# Patient Record
Sex: Male | Born: 1985 | Race: White | Hispanic: No | Marital: Single | Smoking: Never smoker
Health system: Southern US, Community
[De-identification: ages and names within clinical notes are randomized; demographics above are authoritative.]

---

## 2015-05-15 ENCOUNTER — Emergency Department (HOSPITAL_COMMUNITY): Payer: Worker's Compensation

## 2015-05-15 ENCOUNTER — Inpatient Hospital Stay (HOSPITAL_COMMUNITY)
Admission: EM | Admit: 2015-05-15 | Discharge: 2015-05-21 | DRG: 956 | Disposition: A | Discharge status: Rehabilitation Facility | Payer: Worker's Compensation | Attending: General Surgery | Admitting: General Surgery

## 2015-05-15 ENCOUNTER — Encounter (HOSPITAL_COMMUNITY): Payer: Self-pay | Admitting: Emergency Medicine

## 2015-05-15 ENCOUNTER — Inpatient Hospital Stay (HOSPITAL_COMMUNITY): Payer: Worker's Compensation

## 2015-05-15 DIAGNOSIS — S82009A Unspecified fracture of unspecified patella, initial encounter for closed fracture: Secondary | ICD-10-CM

## 2015-05-15 DIAGNOSIS — S060X9A Concussion with loss of consciousness of unspecified duration, initial encounter: Secondary | ICD-10-CM | POA: Diagnosis present

## 2015-05-15 DIAGNOSIS — S93401A Sprain of unspecified ligament of right ankle, initial encounter: Secondary | ICD-10-CM | POA: Diagnosis present

## 2015-05-15 DIAGNOSIS — S060X9S Concussion with loss of consciousness of unspecified duration, sequela: Secondary | ICD-10-CM | POA: Diagnosis not present

## 2015-05-15 DIAGNOSIS — S92332A Displaced fracture of third metatarsal bone, left foot, initial encounter for closed fracture: Secondary | ICD-10-CM | POA: Diagnosis present

## 2015-05-15 DIAGNOSIS — S92302S Fracture of unspecified metatarsal bone(s), left foot, sequela: Secondary | ICD-10-CM | POA: Diagnosis not present

## 2015-05-15 DIAGNOSIS — S7290XA Unspecified fracture of unspecified femur, initial encounter for closed fracture: Secondary | ICD-10-CM | POA: Diagnosis present

## 2015-05-15 DIAGNOSIS — R52 Pain, unspecified: Secondary | ICD-10-CM

## 2015-05-15 DIAGNOSIS — R339 Retention of urine, unspecified: Secondary | ICD-10-CM | POA: Diagnosis present

## 2015-05-15 DIAGNOSIS — S82001S Unspecified fracture of right patella, sequela: Secondary | ICD-10-CM | POA: Diagnosis not present

## 2015-05-15 DIAGNOSIS — R402361 Coma scale, best motor response, obeys commands, in the field [EMT or ambulance]: Secondary | ICD-10-CM | POA: Diagnosis present

## 2015-05-15 DIAGNOSIS — K5901 Slow transit constipation: Secondary | ICD-10-CM | POA: Diagnosis not present

## 2015-05-15 DIAGNOSIS — S92322A Displaced fracture of second metatarsal bone, left foot, initial encounter for closed fracture: Secondary | ICD-10-CM | POA: Diagnosis present

## 2015-05-15 DIAGNOSIS — R402241 Coma scale, best verbal response, confused conversation, in the field [EMT or ambulance]: Secondary | ICD-10-CM | POA: Diagnosis present

## 2015-05-15 DIAGNOSIS — S72401S Unspecified fracture of lower end of right femur, sequela: Secondary | ICD-10-CM | POA: Diagnosis not present

## 2015-05-15 DIAGNOSIS — S069X3S Unspecified intracranial injury with loss of consciousness of 1 hour to 5 hours 59 minutes, sequela: Secondary | ICD-10-CM | POA: Diagnosis not present

## 2015-05-15 DIAGNOSIS — S36039A Unspecified laceration of spleen, initial encounter: Secondary | ICD-10-CM | POA: Diagnosis present

## 2015-05-15 DIAGNOSIS — F419 Anxiety disorder, unspecified: Secondary | ICD-10-CM | POA: Diagnosis not present

## 2015-05-15 DIAGNOSIS — T79A0XA Compartment syndrome, unspecified, initial encounter: Secondary | ICD-10-CM | POA: Diagnosis not present

## 2015-05-15 DIAGNOSIS — S022XXS Fracture of nasal bones, sequela: Secondary | ICD-10-CM | POA: Diagnosis not present

## 2015-05-15 DIAGNOSIS — Y92488 Other paved roadways as the place of occurrence of the external cause: Secondary | ICD-10-CM | POA: Diagnosis not present

## 2015-05-15 DIAGNOSIS — S92302A Fracture of unspecified metatarsal bone(s), left foot, initial encounter for closed fracture: Secondary | ICD-10-CM | POA: Diagnosis present

## 2015-05-15 DIAGNOSIS — S2220XA Unspecified fracture of sternum, initial encounter for closed fracture: Secondary | ICD-10-CM | POA: Diagnosis present

## 2015-05-15 DIAGNOSIS — S72431A Displaced fracture of medial condyle of right femur, initial encounter for closed fracture: Secondary | ICD-10-CM | POA: Diagnosis present

## 2015-05-15 DIAGNOSIS — S92312A Displaced fracture of first metatarsal bone, left foot, initial encounter for closed fracture: Secondary | ICD-10-CM | POA: Diagnosis present

## 2015-05-15 DIAGNOSIS — R402141 Coma scale, eyes open, spontaneous, in the field [EMT or ambulance]: Secondary | ICD-10-CM | POA: Diagnosis present

## 2015-05-15 DIAGNOSIS — S72401D Unspecified fracture of lower end of right femur, subsequent encounter for closed fracture with routine healing: Secondary | ICD-10-CM | POA: Diagnosis not present

## 2015-05-15 DIAGNOSIS — S069X9A Unspecified intracranial injury with loss of consciousness of unspecified duration, initial encounter: Secondary | ICD-10-CM | POA: Diagnosis present

## 2015-05-15 DIAGNOSIS — S82041A Displaced comminuted fracture of right patella, initial encounter for closed fracture: Secondary | ICD-10-CM | POA: Diagnosis present

## 2015-05-15 DIAGNOSIS — S92352A Displaced fracture of fifth metatarsal bone, left foot, initial encounter for closed fracture: Secondary | ICD-10-CM | POA: Diagnosis present

## 2015-05-15 DIAGNOSIS — S36030A Superficial (capsular) laceration of spleen, initial encounter: Secondary | ICD-10-CM | POA: Diagnosis present

## 2015-05-15 DIAGNOSIS — S82001A Unspecified fracture of right patella, initial encounter for closed fracture: Secondary | ICD-10-CM | POA: Diagnosis present

## 2015-05-15 DIAGNOSIS — I959 Hypotension, unspecified: Secondary | ICD-10-CM | POA: Diagnosis present

## 2015-05-15 DIAGNOSIS — Z419 Encounter for procedure for purposes other than remedying health state, unspecified: Secondary | ICD-10-CM

## 2015-05-15 DIAGNOSIS — G8918 Other acute postprocedural pain: Secondary | ICD-10-CM | POA: Insufficient documentation

## 2015-05-15 DIAGNOSIS — S72401A Unspecified fracture of lower end of right femur, initial encounter for closed fracture: Secondary | ICD-10-CM | POA: Diagnosis present

## 2015-05-15 DIAGNOSIS — S022XXA Fracture of nasal bones, initial encounter for closed fracture: Secondary | ICD-10-CM | POA: Diagnosis present

## 2015-05-15 DIAGNOSIS — S069XAA Unspecified intracranial injury with loss of consciousness status unknown, initial encounter: Secondary | ICD-10-CM | POA: Diagnosis present

## 2015-05-15 DIAGNOSIS — S72491S Other fracture of lower end of right femur, sequela: Secondary | ICD-10-CM | POA: Diagnosis not present

## 2015-05-15 DIAGNOSIS — D62 Acute posthemorrhagic anemia: Secondary | ICD-10-CM | POA: Diagnosis not present

## 2015-05-15 DIAGNOSIS — F4323 Adjustment disorder with mixed anxiety and depressed mood: Secondary | ICD-10-CM | POA: Diagnosis not present

## 2015-05-15 DIAGNOSIS — M84453A Pathological fracture, unspecified femur, initial encounter for fracture: Secondary | ICD-10-CM | POA: Diagnosis not present

## 2015-05-15 DIAGNOSIS — S92342A Displaced fracture of fourth metatarsal bone, left foot, initial encounter for closed fracture: Secondary | ICD-10-CM | POA: Diagnosis present

## 2015-05-15 DIAGNOSIS — F1010 Alcohol abuse, uncomplicated: Secondary | ICD-10-CM | POA: Insufficient documentation

## 2015-05-15 DIAGNOSIS — F411 Generalized anxiety disorder: Secondary | ICD-10-CM | POA: Insufficient documentation

## 2015-05-15 LAB — CBC
HEMATOCRIT: 40.6 % (ref 39.0–52.0)
HEMOGLOBIN: 13.9 g/dL (ref 13.0–17.0)
MCH: 29.6 pg (ref 26.0–34.0)
MCHC: 34.2 g/dL (ref 30.0–36.0)
MCV: 86.6 fL (ref 78.0–100.0)
Platelets: 182 10*3/uL (ref 150–400)
RBC: 4.69 MIL/uL (ref 4.22–5.81)
RDW: 11.6 % (ref 11.5–15.5)
WBC: 12.9 10*3/uL — AB (ref 4.0–10.5)

## 2015-05-15 LAB — PROTIME-INR
INR: 1.11 (ref 0.00–1.49)
PROTHROMBIN TIME: 14.5 s (ref 11.6–15.2)

## 2015-05-15 LAB — COMPREHENSIVE METABOLIC PANEL
ALBUMIN: 3.7 g/dL (ref 3.5–5.0)
ALT: 24 U/L (ref 17–63)
ANION GAP: 10 (ref 5–15)
AST: 48 U/L — ABNORMAL HIGH (ref 15–41)
Alkaline Phosphatase: 59 U/L (ref 38–126)
BILIRUBIN TOTAL: 0.8 mg/dL (ref 0.3–1.2)
BUN: 13 mg/dL (ref 6–20)
CHLORIDE: 110 mmol/L (ref 101–111)
CO2: 20 mmol/L — ABNORMAL LOW (ref 22–32)
Calcium: 8.5 mg/dL — ABNORMAL LOW (ref 8.9–10.3)
Creatinine, Ser: 1.01 mg/dL (ref 0.61–1.24)
GFR calc Af Amer: >60 mL/min (ref >60)
GLUCOSE: 126 mg/dL — AB (ref 65–99)
POTASSIUM: 3.9 mmol/L (ref 3.5–5.1)
Sodium: 140 mmol/L (ref 135–145)
TOTAL PROTEIN: 5.7 g/dL — AB (ref 6.5–8.1)

## 2015-05-15 LAB — SAMPLE TO BLOOD BANK

## 2015-05-15 LAB — ETHANOL: Alcohol, Ethyl (B): <5 mg/dL (ref <5)

## 2015-05-15 LAB — CDS SEROLOGY

## 2015-05-15 MED ORDER — HYDROMORPHONE HCL 1 MG/ML IJ SOLN
1.0000 mg | INTRAMUSCULAR | Status: DC | PRN
  Administered 2015-05-15 – 2015-05-16 (×3): 1 mg via INTRAVENOUS
  Filled 2015-05-15 (×2): qty 1

## 2015-05-15 MED ORDER — ONDANSETRON HCL 4 MG/2ML IJ SOLN
4.0000 mg | Freq: Four times a day (QID) | INTRAMUSCULAR | Status: DC | PRN
  Administered 2015-05-15 – 2015-05-16 (×2): 4 mg via INTRAVENOUS
  Filled 2015-05-15 (×2): qty 2

## 2015-05-15 MED ORDER — METHOCARBAMOL 1000 MG/10ML IJ SOLN
500.0000 mg | Freq: Three times a day (TID) | INTRAMUSCULAR | Status: DC
Start: 2015-05-15 — End: 2015-05-15
  Filled 2015-05-15 (×2): qty 5

## 2015-05-15 MED ORDER — FENTANYL CITRATE (PF) 100 MCG/2ML IJ SOLN
50.0000 ug | Freq: Once | INTRAMUSCULAR | Status: AC
  Administered 2015-05-15: 50 ug via INTRAVENOUS
  Filled 2015-05-15: qty 2

## 2015-05-15 MED ORDER — POLYETHYLENE GLYCOL 3350 17 G PO PACK
17.0000 g | PACK | Freq: Every day | ORAL | Status: DC
  Administered 2015-05-17 – 2015-05-19 (×2): 17 g via ORAL
  Filled 2015-05-15 (×3): qty 1

## 2015-05-15 MED ORDER — DOCUSATE SODIUM 100 MG PO CAPS
100.0000 mg | ORAL_CAPSULE | Freq: Two times a day (BID) | ORAL | Status: DC
  Administered 2015-05-15 – 2015-05-21 (×9): 100 mg via ORAL
  Filled 2015-05-15 (×10): qty 1

## 2015-05-15 MED ORDER — IOHEXOL 300 MG/ML  SOLN: 100.0000 mL | Freq: Once | INTRAMUSCULAR | Status: DC | PRN

## 2015-05-15 MED ORDER — POTASSIUM CHLORIDE IN NACL 20-0.45 MEQ/L-% IV SOLN
INTRAVENOUS | Status: DC
  Administered 2015-05-16 (×2): via INTRAVENOUS
  Filled 2015-05-15 (×8): qty 1000

## 2015-05-15 MED ORDER — TETANUS-DIPHTH-ACELL PERTUSSIS 5-2.5-18.5 LF-MCG/0.5 IM SUSP
0.5000 mL | Freq: Once | INTRAMUSCULAR | Status: AC
  Administered 2015-05-15: 0.5 mL via INTRAMUSCULAR
  Filled 2015-05-15: qty 0.5

## 2015-05-15 MED ORDER — ONDANSETRON HCL 4 MG PO TABS: 4.0000 mg | ORAL_TABLET | Freq: Four times a day (QID) | ORAL | Status: DC | PRN

## 2015-05-15 MED ORDER — HYDROMORPHONE HCL 1 MG/ML IJ SOLN
0.5000 mg | INTRAMUSCULAR | Status: DC | PRN
  Administered 2015-05-15: 0.5 mg via INTRAVENOUS
  Filled 2015-05-15: qty 1

## 2015-05-15 MED ORDER — DEXTROSE 5 % IV SOLN
500.0000 mg | Freq: Three times a day (TID) | INTRAVENOUS | Status: DC | PRN
  Administered 2015-05-15 – 2015-05-16 (×2): 500 mg via INTRAVENOUS
  Filled 2015-05-15: qty 5

## 2015-05-15 MED ORDER — PANTOPRAZOLE SODIUM 40 MG IV SOLR
40.0000 mg | Freq: Every day | INTRAVENOUS | Status: DC
  Administered 2015-05-15 – 2015-05-16 (×2): 40 mg via INTRAVENOUS
  Filled 2015-05-15 (×2): qty 40

## 2015-05-15 MED ORDER — OXYCODONE HCL 5 MG PO TABS
5.0000 mg | ORAL_TABLET | ORAL | Status: DC | PRN
  Administered 2015-05-15: 10 mg via ORAL
  Administered 2015-05-15: 5 mg via ORAL
  Administered 2015-05-16 (×2): 15 mg via ORAL
  Filled 2015-05-15: qty 2
  Filled 2015-05-15: qty 3
  Filled 2015-05-15: qty 1
  Filled 2015-05-15: qty 3

## 2015-05-15 MED ORDER — METHOCARBAMOL 1000 MG/10ML IJ SOLN: 500.0000 mg | Freq: Three times a day (TID) | INTRAMUSCULAR | Status: DC | PRN

## 2015-05-15 MED ORDER — METHOCARBAMOL 1000 MG/10ML IJ SOLN: 500.0000 mg | Freq: Three times a day (TID) | INTRAMUSCULAR | Status: DC

## 2015-05-15 MED ORDER — HYDROMORPHONE HCL 1 MG/ML IJ SOLN
INTRAMUSCULAR | Status: AC
  Filled 2015-05-15: qty 1

## 2015-05-15 MED ORDER — PANTOPRAZOLE SODIUM 40 MG PO TBEC: 40.0000 mg | DELAYED_RELEASE_TABLET | Freq: Every day | ORAL | Status: DC
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
## 2015-05-15 NOTE — ED Notes (Signed)
Pt visibly uncomfortable. Verbal order given for  IV robaxin. Pharmacy contacted to send stat. Pt and family both appear to be anxious.
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
## 2015-05-15 NOTE — ED Notes (Signed)
Applied ice to left leg

## 2015-05-15 NOTE — Consult Note (Signed)
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 groin tenderness rotation of the left leg. Sensation intact on the dorsal plantar aspect of the left foot  Examination of the right foot demonstrates some swelling around the medial lateral malleolus. Syndesmosis feel stable. Compartments soft on the right-hand side. Small abrasion present on the right knee with significant effusion present patella was laterally subluxated. Gapping is present on the medial aspect of the patella deep to the skin. No groin pain with internal/external rotation of the right leg Assessment/Plan: Impression is left foot metatarsal fracture of metatarsal #1 with metatarsal head fractures 2 through 5. The metatarsal shaft fracture is displaced and will need fixation. This does not look like a Lisfranc injury. We will be able to better assess that once fixation on the first metatarsal is then performed. In regards to the right knee the patient on CT scan has evidence of inferior pole avulsion fracture along with medial patellar fracture indicating traumatic instability. Ligamentous examination really not possible due to the pain in the patient's having. Plan for the right knee is MRI scan to evaluate what is likely a large chondral defect on that lateral femoral condyle. He has an abrasion on the anterior  aspect of the knee which may preclude surgical intervention for the problem at this time. The fluid plan on aspiration of the knee and examination under anesthesia tomorrow during the time of fixation for the left foot fracture. Risks and benefits of surgical intervention discussed with the patient including but limited to infection or vessel damage prolonged period of nonweightbearing as well as need for further surgery on the right knee which could potentially be done either tomorrow or later time once 2 weeks depending on how the abrasion looks.  Cornelis Kluver SCOTT 05/15/2015, 6:50 PM

## 2015-05-15 NOTE — ED Notes (Signed)
Trauma MD at bedside.

## 2015-05-15 NOTE — ED Notes (Signed)
Ortho MD at bedside.
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
## 2015-05-15 NOTE — Progress Notes (Signed)
Orthopedic Tech Progress Note Patient Details:  Gary Hebert 1986/02/25 [id number]  Ortho Devices Type of Ortho Device: Ace wrap, Knee Immobilizer Ortho Device/Splint Location: RLE Ortho Device/Splint Interventions: Ordered, Application   Jennye Moccasin 05/15/2015, 7:23 PM

## 2015-05-15 NOTE — ED Notes (Signed)
Attempted report x 2 

## 2015-05-15 NOTE — ED Notes (Signed)
Pt's wife- Gary Hebert [phone]. EMS called pt's wife and she is on the way to hospital.
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
## 2015-05-15 NOTE — Progress Notes (Signed)
Orthopedic Tech Progress Note Patient Details:  Gary Hebert 1986-01-19 [id number]  Ortho Devices Type of Ortho Device: Ace wrap, Post (short leg) splint Ortho Device/Splint Location: LLE Ortho Device/Splint Interventions: Ordered, Application   Jennye Moccasin 05/15/2015, 8:46 PM
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
## 2015-05-15 NOTE — ED Notes (Signed)
Re-paged Dr. August Saucer x3 to [phone]

## 2015-05-15 NOTE — ED Notes (Signed)
Pt alert oriented to person, date- however asking repetitive questions about situation and place.
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
## 2015-05-15 NOTE — ED Notes (Signed)
Attempted report x1. 
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
## 2015-05-16 ENCOUNTER — Inpatient Hospital Stay (HOSPITAL_COMMUNITY): Payer: Worker's Compensation | Admitting: Certified Registered Nurse Anesthetist

## 2015-05-16 ENCOUNTER — Inpatient Hospital Stay (HOSPITAL_COMMUNITY): Payer: Worker's Compensation

## 2015-05-16 ENCOUNTER — Encounter (HOSPITAL_COMMUNITY): Admission: EM | Disposition: A | Discharge status: Rehabilitation Facility | Payer: Self-pay | Source: Home / Self Care

## 2015-05-16 DIAGNOSIS — S7290XA Unspecified fracture of unspecified femur, initial encounter for closed fracture: Secondary | ICD-10-CM | POA: Diagnosis present

## 2015-05-16 DIAGNOSIS — S82001A Unspecified fracture of right patella, initial encounter for closed fracture: Secondary | ICD-10-CM | POA: Diagnosis present

## 2015-05-16 DIAGNOSIS — S069XAA Unspecified intracranial injury with loss of consciousness status unknown, initial encounter: Secondary | ICD-10-CM | POA: Diagnosis present

## 2015-05-16 DIAGNOSIS — D62 Acute posthemorrhagic anemia: Secondary | ICD-10-CM | POA: Diagnosis not present

## 2015-05-16 DIAGNOSIS — S022XXA Fracture of nasal bones, initial encounter for closed fracture: Secondary | ICD-10-CM | POA: Diagnosis present

## 2015-05-16 DIAGNOSIS — S92302A Fracture of unspecified metatarsal bone(s), left foot, initial encounter for closed fracture: Secondary | ICD-10-CM | POA: Diagnosis present

## 2015-05-16 DIAGNOSIS — S93401A Sprain of unspecified ligament of right ankle, initial encounter: Secondary | ICD-10-CM | POA: Diagnosis present

## 2015-05-16 DIAGNOSIS — S36039A Unspecified laceration of spleen, initial encounter: Secondary | ICD-10-CM | POA: Diagnosis present

## 2015-05-16 DIAGNOSIS — S069X9A Unspecified intracranial injury with loss of consciousness of unspecified duration, initial encounter: Secondary | ICD-10-CM | POA: Diagnosis present

## 2015-05-16 DIAGNOSIS — S72401A Unspecified fracture of lower end of right femur, initial encounter for closed fracture: Secondary | ICD-10-CM | POA: Diagnosis present

## 2015-05-16 HISTORY — PX: COMPARTMENT MEASUREMENT: SHX6371

## 2015-05-16 HISTORY — PX: ORIF FEMUR FRACTURE: SHX2119

## 2015-05-16 HISTORY — PX: PERCUTANEOUS PINNING: SHX2209

## 2015-05-16 LAB — BASIC METABOLIC PANEL
ANION GAP: 11 (ref 5–15)
BUN: 10 mg/dL (ref 6–20)
CHLORIDE: 106 mmol/L (ref 101–111)
CO2: 22 mmol/L (ref 22–32)
CREATININE: 1.12 mg/dL (ref 0.61–1.24)
Calcium: 8.5 mg/dL — ABNORMAL LOW (ref 8.9–10.3)
GFR calc non Af Amer: >60 mL/min (ref >60)
Glucose, Bld: 110 mg/dL — ABNORMAL HIGH (ref 65–99)
POTASSIUM: 3.8 mmol/L (ref 3.5–5.1)
SODIUM: 139 mmol/L (ref 135–145)

## 2015-05-16 LAB — CBC
HCT: 36.6 % — ABNORMAL LOW (ref 39.0–52.0)
Hemoglobin: 12.5 g/dL — ABNORMAL LOW (ref 13.0–17.0)
MCH: 29.5 pg (ref 26.0–34.0)
MCHC: 34.2 g/dL (ref 30.0–36.0)
MCV: 86.3 fL (ref 78.0–100.0)
PLATELETS: 148 10*3/uL — AB (ref 150–400)
RBC: 4.24 MIL/uL (ref 4.22–5.81)
RDW: 11.7 % (ref 11.5–15.5)
WBC: 6.5 10*3/uL (ref 4.0–10.5)

## 2015-05-16 LAB — MRSA PCR SCREENING: MRSA BY PCR: NEGATIVE

## 2015-05-16 SURGERY — COMPARTMENT MEASUREMENT
Anesthesia: General | Site: Toe | Laterality: Right

## 2015-05-16 MED ORDER — OXYCODONE HCL 5 MG/5ML PO SOLN: 5.0000 mg | Freq: Once | ORAL | Status: DC | PRN

## 2015-05-16 MED ORDER — KETAMINE HCL 100 MG/ML IJ SOLN
INTRAMUSCULAR | Status: DC | PRN
  Administered 2015-05-16: 100 mg via INTRAVENOUS
  Administered 2015-05-16: 50 mg via INTRAVENOUS

## 2015-05-16 MED ORDER — FENTANYL CITRATE (PF) 250 MCG/5ML IJ SOLN
INTRAMUSCULAR | Status: AC
  Filled 2015-05-16: qty 5

## 2015-05-16 MED ORDER — HYDROMORPHONE 1 MG/ML IV SOLN
INTRAVENOUS | Status: DC
Start: 2015-05-16 — End: 2015-05-16
  Administered 2015-05-16: 0.5 mg via INTRAVENOUS

## 2015-05-16 MED ORDER — OXYCODONE HCL 5 MG PO TABS: 15.0000 mg | ORAL_TABLET | Freq: Once | ORAL | Status: DC | PRN

## 2015-05-16 MED ORDER — ACETAMINOPHEN 650 MG RE SUPP: 650.0000 mg | Freq: Four times a day (QID) | RECTAL | Status: DC | PRN

## 2015-05-16 MED ORDER — SODIUM CHLORIDE 0.9% FLUSH: 9.0000 mL | INTRAVENOUS | Status: DC | PRN

## 2015-05-16 MED ORDER — NALOXONE HCL 0.4 MG/ML IJ SOLN: 0.4000 mg | INTRAMUSCULAR | Status: DC | PRN

## 2015-05-16 MED ORDER — SUCCINYLCHOLINE CHLORIDE 20 MG/ML IJ SOLN
INTRAMUSCULAR | Status: DC | PRN
  Administered 2015-05-16: 100 mg via INTRAVENOUS

## 2015-05-16 MED ORDER — MIDAZOLAM HCL 2 MG/2ML IJ SOLN
INTRAMUSCULAR | Status: AC
Start: 2015-05-16 — End: 2015-05-16
  Filled 2015-05-16: qty 2

## 2015-05-16 MED ORDER — DIPHENHYDRAMINE HCL 12.5 MG/5ML PO ELIX: 12.5000 mg | ORAL_SOLUTION | Freq: Four times a day (QID) | ORAL | Status: DC | PRN

## 2015-05-16 MED ORDER — FENTANYL CITRATE (PF) 250 MCG/5ML IJ SOLN
INTRAMUSCULAR | Status: DC | PRN
  Administered 2015-05-16: 100 ug via INTRAVENOUS
  Administered 2015-05-16 (×3): 50 ug via INTRAVENOUS

## 2015-05-16 MED ORDER — HYDROMORPHONE HCL 1 MG/ML IJ SOLN
INTRAMUSCULAR | Status: AC
  Administered 2015-05-18: 0.5 mg via INTRAVENOUS
  Filled 2015-05-16: qty 2

## 2015-05-16 MED ORDER — PROPOFOL 10 MG/ML IV BOLUS
INTRAVENOUS | Status: AC
  Filled 2015-05-16: qty 20

## 2015-05-16 MED ORDER — PROMETHAZINE HCL 25 MG/ML IJ SOLN: 6.2500 mg | INTRAMUSCULAR | Status: DC | PRN

## 2015-05-16 MED ORDER — ONDANSETRON HCL 4 MG/2ML IJ SOLN
INTRAMUSCULAR | Status: AC
  Filled 2015-05-16: qty 2

## 2015-05-16 MED ORDER — DIPHENHYDRAMINE HCL 50 MG/ML IJ SOLN: 12.5000 mg | Freq: Four times a day (QID) | INTRAMUSCULAR | Status: DC | PRN

## 2015-05-16 MED ORDER — METOCLOPRAMIDE HCL 5 MG PO TABS: 5.0000 mg | ORAL_TABLET | Freq: Three times a day (TID) | ORAL | Status: DC | PRN

## 2015-05-16 MED ORDER — CEFAZOLIN SODIUM-DEXTROSE 2-3 GM-% IV SOLR
INTRAVENOUS | Status: DC | PRN
  Administered 2015-05-16 (×2): 2 g via INTRAVENOUS

## 2015-05-16 MED ORDER — METOCLOPRAMIDE HCL 5 MG/ML IJ SOLN: 5.0000 mg | Freq: Three times a day (TID) | INTRAMUSCULAR | Status: DC | PRN

## 2015-05-16 MED ORDER — ONDANSETRON HCL 4 MG PO TABS: 4.0000 mg | ORAL_TABLET | Freq: Four times a day (QID) | ORAL | Status: DC | PRN

## 2015-05-16 MED ORDER — LIDOCAINE HCL (CARDIAC) 20 MG/ML IV SOLN
INTRAVENOUS | Status: AC
  Filled 2015-05-16: qty 5

## 2015-05-16 MED ORDER — POTASSIUM CHLORIDE IN NACL 20-0.45 MEQ/L-% IV SOLN
INTRAVENOUS | Status: DC
  Filled 2015-05-16 (×2): qty 1000

## 2015-05-16 MED ORDER — PROPOFOL 10 MG/ML IV BOLUS
INTRAVENOUS | Status: DC | PRN
  Administered 2015-05-16: 200 mg via INTRAVENOUS

## 2015-05-16 MED ORDER — ONDANSETRON HCL 4 MG/2ML IJ SOLN
4.0000 mg | Freq: Four times a day (QID) | INTRAMUSCULAR | Status: DC | PRN
  Administered 2015-05-16 – 2015-05-19 (×3): 4 mg via INTRAVENOUS
  Filled 2015-05-16 (×3): qty 2

## 2015-05-16 MED ORDER — KETAMINE HCL 100 MG/ML IJ SOLN
INTRAMUSCULAR | Status: AC
  Filled 2015-05-16: qty 1

## 2015-05-16 MED ORDER — ONDANSETRON HCL 4 MG/2ML IJ SOLN
INTRAMUSCULAR | Status: DC | PRN
  Administered 2015-05-16: 4 mg via INTRAVENOUS

## 2015-05-16 MED ORDER — CEFAZOLIN SODIUM-DEXTROSE 2-3 GM-% IV SOLR
INTRAVENOUS | Status: AC
  Filled 2015-05-16: qty 50

## 2015-05-16 MED ORDER — DEXAMETHASONE SODIUM PHOSPHATE 4 MG/ML IJ SOLN
INTRAMUSCULAR | Status: DC | PRN
  Administered 2015-05-16: 10 mg via INTRAVENOUS

## 2015-05-16 MED ORDER — DEXAMETHASONE SODIUM PHOSPHATE 10 MG/ML IJ SOLN
INTRAMUSCULAR | Status: AC
  Filled 2015-05-16: qty 1

## 2015-05-16 MED ORDER — LACTATED RINGERS IV SOLN
INTRAVENOUS | Status: DC
  Administered 2015-05-16 (×4): via INTRAVENOUS

## 2015-05-16 MED ORDER — METHOCARBAMOL 1000 MG/10ML IJ SOLN
1000.0000 mg | Freq: Three times a day (TID) | INTRAVENOUS | Status: DC | PRN
  Administered 2015-05-16 – 2015-05-17 (×2): 1000 mg via INTRAVENOUS
  Filled 2015-05-16 (×2): qty 10

## 2015-05-16 MED ORDER — ONDANSETRON HCL 4 MG/2ML IJ SOLN: 4.0000 mg | Freq: Four times a day (QID) | INTRAMUSCULAR | Status: DC | PRN

## 2015-05-16 MED ORDER — 0.9 % SODIUM CHLORIDE (POUR BTL) OPTIME
TOPICAL | Status: DC | PRN
  Administered 2015-05-16 (×3): 1000 mL

## 2015-05-16 MED ORDER — ACETAMINOPHEN 325 MG PO TABS
650.0000 mg | ORAL_TABLET | Freq: Four times a day (QID) | ORAL | Status: DC | PRN
  Administered 2015-05-17 – 2015-05-19 (×4): 650 mg via ORAL
  Filled 2015-05-16 (×4): qty 2

## 2015-05-16 MED ORDER — HYDROMORPHONE HCL 1 MG/ML IJ SOLN
0.2500 mg | INTRAMUSCULAR | Status: DC | PRN
  Administered 2015-05-16 (×3): 0.5 mg via INTRAVENOUS

## 2015-05-16 MED ORDER — HYDROMORPHONE 1 MG/ML IV SOLN
INTRAVENOUS | Status: DC
  Administered 2015-05-16: 8 mg via INTRAVENOUS
  Administered 2015-05-16: 4.96 mg via INTRAVENOUS
  Administered 2015-05-17: 6.22 mg via INTRAVENOUS
  Administered 2015-05-17: 02:00:00 via INTRAVENOUS
  Filled 2015-05-16: qty 25

## 2015-05-16 MED ORDER — SUCCINYLCHOLINE CHLORIDE 20 MG/ML IJ SOLN
INTRAMUSCULAR | Status: AC
  Filled 2015-05-16: qty 1

## 2015-05-16 MED ORDER — HYDROMORPHONE 1 MG/ML IV SOLN
INTRAVENOUS | Status: DC
  Administered 2015-05-16: 03:00:00 via INTRAVENOUS
  Administered 2015-05-16: 3 mg via INTRAVENOUS
  Filled 2015-05-16: qty 25

## 2015-05-16 MED ORDER — CETYLPYRIDINIUM CHLORIDE 0.05 % MT LIQD
7.0000 mL | Freq: Two times a day (BID) | OROMUCOSAL | Status: DC
  Administered 2015-05-16 – 2015-05-21 (×7): 7 mL via OROMUCOSAL

## 2015-05-16 MED ORDER — MIDAZOLAM HCL 5 MG/5ML IJ SOLN
INTRAMUSCULAR | Status: DC | PRN
  Administered 2015-05-16: 2 mg via INTRAVENOUS

## 2015-05-16 MED ORDER — CEFAZOLIN SODIUM-DEXTROSE 2-3 GM-% IV SOLR
2.0000 g | Freq: Four times a day (QID) | INTRAVENOUS | Status: AC
  Administered 2015-05-17 (×3): 2 g via INTRAVENOUS
  Filled 2015-05-16 (×4): qty 50

## 2015-05-16 SURGICAL SUPPLY — 71 items
ANCHOR PEEK CORKSCREW 4.5 (Anchor) ×5 IMPLANT
ANCHOR PEEK CORKSCREW 4.5MM (Anchor) ×1 IMPLANT
BANDAGE ELASTIC 4 VELCRO ST LF (GAUZE/BANDAGES/DRESSINGS) ×6 IMPLANT
BENZOIN TINCTURE PRP APPL 2/3 (GAUZE/BANDAGES/DRESSINGS) ×18 IMPLANT
BLADE SURG ROTATE 9660 (MISCELLANEOUS) ×6 IMPLANT
BNDG ELASTIC 6X10 VLCR STRL LF (GAUZE/BANDAGES/DRESSINGS) ×6 IMPLANT
BNDG ESMARK 4X9 LF (GAUZE/BANDAGES/DRESSINGS) ×6 IMPLANT
CANISTER WOUND CARE 500ML ATS (WOUND CARE) ×6 IMPLANT
CLOSURE WOUND 1/2 X4 (GAUZE/BANDAGES/DRESSINGS) ×1
CONNECTOR Y ATS VAC SYSTEM (MISCELLANEOUS) ×6 IMPLANT
COVER SURGICAL LIGHT HANDLE (MISCELLANEOUS) ×12 IMPLANT
CUFF TOURNIQUET SINGLE 34IN LL (TOURNIQUET CUFF) ×6 IMPLANT
DRAPE C-ARM 42X72 X-RAY (DRAPES) ×12 IMPLANT
DRAPE C-ARMOR (DRAPES) ×12 IMPLANT
DRAPE INCISE IOBAN 66X45 STRL (DRAPES) ×12 IMPLANT
DRAPE U-SHAPE 47X51 STRL (DRAPES) IMPLANT
DRSG AQUACEL AG ADV 3.5X10 (GAUZE/BANDAGES/DRESSINGS) ×6 IMPLANT
DRSG MEPITEL 4X7.2 (GAUZE/BANDAGES/DRESSINGS) ×6 IMPLANT
DRSG PAD ABDOMINAL 8X10 ST (GAUZE/BANDAGES/DRESSINGS) ×12 IMPLANT
DRSG VAC ATS SM SENSATRAC (GAUZE/BANDAGES/DRESSINGS) ×6 IMPLANT
DURAPREP 26ML APPLICATOR (WOUND CARE) ×12 IMPLANT
ELECT REM PT RETURN 9FT ADLT (ELECTROSURGICAL) ×6
ELECTRODE REM PT RTRN 9FT ADLT (ELECTROSURGICAL) ×4 IMPLANT
GLOVE BIO SURGEON STRL SZ 6 (GLOVE) ×6 IMPLANT
GLOVE BIO SURGEON STRL SZ8 (GLOVE) ×6 IMPLANT
GLOVE BIOGEL PI IND STRL 6.5 (GLOVE) ×8 IMPLANT
GLOVE BIOGEL PI IND STRL 8 (GLOVE) ×12 IMPLANT
GLOVE BIOGEL PI INDICATOR 6.5 (GLOVE) ×4
GLOVE BIOGEL PI INDICATOR 8 (GLOVE) ×6
GLOVE ECLIPSE 6.5 STRL STRAW (GLOVE) ×6 IMPLANT
GLOVE OPTIFIT SS 8.0 STRL (GLOVE) ×6 IMPLANT
GLOVE SURG ORTHO 8.0 STRL STRW (GLOVE) ×12 IMPLANT
GOWN STRL REUS W/ TWL LRG LVL3 (GOWN DISPOSABLE) ×8 IMPLANT
GOWN STRL REUS W/TWL LRG LVL3 (GOWN DISPOSABLE) ×4
K-WIRE DBL TROCAR .062X4 ×12 IMPLANT
K-WIRE SMOOTH (WIRE) ×18
KIT BASIN OR (CUSTOM PROCEDURE TRAY) ×6 IMPLANT
KIT MAXAN 1 LEVEL (KITS) ×6 IMPLANT
KIT ROOM TURNOVER OR (KITS) ×6 IMPLANT
KWIRE DBL TROCAR .062X4 ×8 IMPLANT
KWIRE SMOOTH (WIRE) ×12 IMPLANT
MANIFOLD NEPTUNE II (INSTRUMENTS) IMPLANT
NDL SUT 6 .5 CRC .975X.05 MAYO (NEEDLE) ×4 IMPLANT
NEEDLE MAYO TAPER (NEEDLE) ×2
NS IRRIG 1000ML POUR BTL (IV SOLUTION) ×6 IMPLANT
PACK ORTHO EXTREMITY (CUSTOM PROCEDURE TRAY) ×6 IMPLANT
PAD ARMBOARD 7.5X6 YLW CONV (MISCELLANEOUS) ×12 IMPLANT
PENCIL BUTTON HOLSTER BLD 10FT (ELECTRODE) ×6 IMPLANT
SCREW LAG CORT CANN 4.5 20X55 (Screw) ×6 IMPLANT
SCREW LAG CORT CANN 4.5 20X65 (Screw) ×6 IMPLANT
SET MONITOR QUICK PRESSURE (MISCELLANEOUS) ×6 IMPLANT
SPECIMEN JAR SMALL (MISCELLANEOUS) IMPLANT
SPLINT PLASTER CAST XFAST 5X30 (CAST SUPPLIES) ×4 IMPLANT
SPLINT PLASTER XFAST SET 5X30 (CAST SUPPLIES) ×2
SPONGE LAP 18X18 X RAY DECT (DISPOSABLE) ×18 IMPLANT
STRIP CLOSURE SKIN 1/2X4 (GAUZE/BANDAGES/DRESSINGS) ×5 IMPLANT
SUCTION FRAZIER HANDLE 10FR (MISCELLANEOUS) ×2
SUCTION TUBE FRAZIER 10FR DISP (MISCELLANEOUS) ×4 IMPLANT
SUT MNCRL AB 3-0 PS2 18 (SUTURE) ×6 IMPLANT
SUT VIC AB 0 CT1 27 (SUTURE) ×4
SUT VIC AB 0 CT1 27XBRD ANBCTR (SUTURE) ×8 IMPLANT
SUT VIC AB 1 CT1 27 (SUTURE) ×14
SUT VIC AB 1 CT1 27XBRD ANBCTR (SUTURE) ×28 IMPLANT
SUT VIC AB 2-0 CT1 27 (SUTURE) ×4
SUT VIC AB 2-0 CT1 TAPERPNT 27 (SUTURE) ×8 IMPLANT
TOWEL OR 17X24 6PK STRL BLUE (TOWEL DISPOSABLE) ×6 IMPLANT
TOWEL OR 17X26 10 PK STRL BLUE (TOWEL DISPOSABLE) ×6 IMPLANT
TUBE CONNECTING 12'X1/4 (SUCTIONS) ×1
TUBE CONNECTING 12X1/4 (SUCTIONS) ×5 IMPLANT
WATER STERILE IRR 1000ML POUR (IV SOLUTION) ×6 IMPLANT
YANKAUER SUCT BULB TIP NO VENT (SUCTIONS) ×6 IMPLANT

## 2015-05-16 NOTE — Progress Notes (Signed)
Pt's wife reports that pt has not voided since arrival to ED. Pt states urge to void is present but cannot currently. Bladder scan shows 695 cc urine. Pt encouraged to try to void to avoid in and out cath. Will continue to monitor closely.
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
## 2015-05-16 NOTE — Brief Op Note (Signed)
05/15/2015 - 05/16/2015  10:06 PM  PATIENT:  Gary Hebert  30 y.o. male  PRE-OPERATIVE DIAGNOSIS:  left toe fracture right distal femur medial epicondyle fracture, right patella comminuted fracture with disruption of the medial retinaculum  POST-OPERATIVE DIAGNOSIS: Left foot first metatarsal fracture early compartment syndrome left foot right knee medial epicondyle fracture nondisplaced right knee patella fracture comminuted medially right knee medial patella retinacular disruption with patellar instability*  PROCEDURE:  Procedure(s): COMPARTMENT MEASUREMENT PERCUTANEOUS PINNING LEFT FIRST METATARSAL RIGHT OPEN REDUCTION INTERNAL FIXATION MEDIAL CONDYLE FRACTURE, RIGHT MEDIAL RETINACULAR REPAIR -patella fracture comminution removal  SURGEON:  Surgeon(s): Cammy Copa, MD  ASSISTANT: Albin Felling BethuneRNFA  ANESTHESIA:   general  EBL: 75 ml    Total I/O In: 1000 [I.V.:1000] Out: 400 [Urine:400]  BLOOD ADMINISTERED: none  DRAINS: Wound VAC left foot   LOCAL MEDICATIONS USED:  none  SPECIMEN:  No Specimen  COUNTS:  YES  TOURNIQUET:   Total Tourniquet Time Documented: Thigh (Right) - 61 minutes Total: Thigh (Right) - 61 minutes   DICTATION: .Other Dictation: Dictation Number [id number]  PLAN OF CARE: Admit to inpatient   PATIENT DISPOSITION:  PACU - hemodynamically stable
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
## 2015-05-16 NOTE — Transfer of Care (Signed)
Immediate Anesthesia Transfer of Care Note  Patient: Gary Hebert  Procedure(s) Performed: Procedure(s): COMPARTMENT MEASUREMENT (Left) PERCUTANEOUS PINNING LEFT FIRST METATARSAL (Left) RIGHT OPEN REDUCTION INTERNAL FIXATION MEDIAL EPICONDYLE FRACTURE, RIGHT MEDIAL RETINACULAR REPAIR (Right)  Patient Location: PACU  Anesthesia Type:General  Level of Consciousness: sedated, patient cooperative and responds to stimulation  Airway & Oxygen Therapy: Patient Spontanous Breathing and Patient connected to nasal cannula oxygen  Post-op Assessment: Report given to RN, Post -op Vital signs reviewed and stable and Patient moving all extremities X 4  Post vital signs: Reviewed and stable  Last Vitals:  Filed Vitals:   05/16/15 1537 05/16/15 2214  BP: 131/71 132/77  Pulse: 97   Temp: 36.7 C 37.1 C  Resp: 15 22    Complications: No apparent anesthesia complications

## 2015-05-16 NOTE — Anesthesia Procedure Notes (Signed)
Procedure Name: Intubation Date/Time: 05/16/2015 5:40 PM Performed by: Reine Just Pre-anesthesia Checklist: Patient identified, Timeout performed, Emergency Drugs available, Suction available and Patient being monitored Patient Re-evaluated:Patient Re-evaluated prior to inductionOxygen Delivery Method: Circle system utilized and Simple face mask Preoxygenation: Pre-oxygenation with 100% oxygen Intubation Type: IV induction Ventilation: Mask ventilation without difficulty Laryngoscope Size: Miller and 3 Grade View: Grade I Tube type: Oral Tube size: 7.5 mm Number of attempts: 2 Airway Equipment and Method: Patient positioned with wedge pillow and Stylet Placement Confirmation: ETT inserted through vocal cords under direct vision,  positive ETCO2 and breath sounds checked- equal and bilateral Secured at: 22 cm Tube secured with: Tape Dental Injury: Teeth and Oropharynx as per pre-operative assessment

## 2015-05-16 NOTE — Progress Notes (Signed)
PT Cancellation Note  Patient Details Name: Gary Hebert MRN: [id number] DOB: 07-27-1985   Cancelled Treatment:    Reason Eval/Treat Not Completed: Pain limiting ability to participate; Patient moaning in pain and RN reports has had medication and is on PCA.  Offered to patient to assist with mobility as tolerated, but he refused due to pain.  Noted for surgery this pm.  Will attempt to see tomorrow as able.   Elray Mcgregor 05/16/2015, 2:13 PM  Sheran Lawless, [place] [phone] 05/16/2015

## 2015-05-16 NOTE — Progress Notes (Signed)
In and out catheterization performed per MD order. Dianna Rossetti, RN and Nadine Counts, NT present. Sterile procedure maintained throughout procedure. 800 cc urine drained. Will continue to monitor closely.  Herma Ard, RN

## 2015-05-16 NOTE — Progress Notes (Signed)
Patient for scheduled surgery on left foot and right knee Rationale and risks and benefits discussed in performing the right knee surgery at this time. In general I would be hesitant to let him weight-bear on that nondisplaced medial femoral condyle fracture. I think if screw fixation can be performed we can mobilize him earlier. He was same way the medial retinaculum and could potentially be repaired in order to facilitate patellar stability.  In regards to the left foot the patient is having pain in the left foot with metatarsal head fractures 2 through 5 and metatarsal shaft fracture. The Lisfranc joint appears stable on plain radiographs. His compartments feel soft to palpation today but he is having a lot of pain in the foot. We'll plan to measure compartments Intra-Op on the left foot.  All questions answered
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
## 2015-05-16 NOTE — Anesthesia Postprocedure Evaluation (Signed)
Anesthesia Post Note  Patient: Shriyans Kuenzi  Procedure(s) Performed: Procedure(s) (LRB): COMPARTMENT MEASUREMENT (Left) PERCUTANEOUS PINNING LEFT FIRST METATARSAL (Left) RIGHT OPEN REDUCTION INTERNAL FIXATION MEDIAL CONDYLE FRACTURE, RIGHT MEDIAL RETINACULAR REPAIR (Right)  Patient location during evaluation: PACU Anesthesia Type: General Level of consciousness: awake and alert, patient cooperative and oriented Pain management: pain level controlled Vital Signs Assessment: post-procedure vital signs reviewed and stable Respiratory status: spontaneous breathing, nonlabored ventilation, respiratory function stable and patient connected to nasal cannula oxygen Cardiovascular status: blood pressure returned to baseline and stable Postop Assessment: no signs of nausea or vomiting Anesthetic complications: no    Last Vitals:  Filed Vitals:   05/16/15 2230 05/16/15 2236  BP: 131/74 133/78  Pulse: 116 101  Temp:  36.7 C  Resp: 17 16    Last Pain:  Filed Vitals:   05/16/15 2240  PainSc: 6                  Diavion Labrador,E. Ahyana Skillin
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
## 2015-05-17 ENCOUNTER — Encounter (HOSPITAL_COMMUNITY): Payer: Self-pay | Admitting: Orthopedic Surgery

## 2015-05-17 DIAGNOSIS — R339 Retention of urine, unspecified: Secondary | ICD-10-CM | POA: Diagnosis present

## 2015-05-17 LAB — CBC
HEMATOCRIT: 31.2 % — AB (ref 39.0–52.0)
Hemoglobin: 11.2 g/dL — ABNORMAL LOW (ref 13.0–17.0)
MCH: 31.2 pg (ref 26.0–34.0)
MCHC: 35.9 g/dL (ref 30.0–36.0)
MCV: 86.9 fL (ref 78.0–100.0)
PLATELETS: 151 10*3/uL (ref 150–400)
RBC: 3.59 MIL/uL — ABNORMAL LOW (ref 4.22–5.81)
RDW: 11.4 % — AB (ref 11.5–15.5)
WBC: 7.5 10*3/uL (ref 4.0–10.5)

## 2015-05-17 MED ORDER — DIAZEPAM 5 MG PO TABS
5.0000 mg | ORAL_TABLET | Freq: Once | ORAL | Status: AC
  Administered 2015-05-17: 5 mg via ORAL
  Filled 2015-05-17: qty 1

## 2015-05-17 MED ORDER — HYDROMORPHONE HCL 1 MG/ML IJ SOLN
1.0000 mg | INTRAMUSCULAR | Status: DC | PRN
  Administered 2015-05-17 – 2015-05-19 (×7): 1 mg via INTRAVENOUS
  Filled 2015-05-17 (×8): qty 1

## 2015-05-17 MED ORDER — BETHANECHOL CHLORIDE 25 MG PO TABS
25.0000 mg | ORAL_TABLET | Freq: Four times a day (QID) | ORAL | Status: DC
  Administered 2015-05-17 – 2015-05-21 (×17): 25 mg via ORAL
  Filled 2015-05-17 (×17): qty 1

## 2015-05-17 MED ORDER — DIAZEPAM 5 MG PO TABS
5.0000 mg | ORAL_TABLET | Freq: Three times a day (TID) | ORAL | Status: DC | PRN
  Administered 2015-05-17 – 2015-05-20 (×8): 5 mg via ORAL
  Filled 2015-05-17 (×7): qty 1

## 2015-05-17 MED ORDER — TRAMADOL HCL 50 MG PO TABS
100.0000 mg | ORAL_TABLET | Freq: Four times a day (QID) | ORAL | Status: DC
  Administered 2015-05-17 – 2015-05-21 (×15): 100 mg via ORAL
  Filled 2015-05-17 (×15): qty 2

## 2015-05-17 MED ORDER — METHOCARBAMOL 1000 MG/10ML IJ SOLN
1000.0000 mg | Freq: Four times a day (QID) | INTRAVENOUS | Status: DC | PRN
  Administered 2015-05-17: 1000 mg via INTRAVENOUS
  Filled 2015-05-17: qty 10

## 2015-05-17 MED ORDER — KETOROLAC TROMETHAMINE 15 MG/ML IJ SOLN
15.0000 mg | Freq: Once | INTRAMUSCULAR | Status: AC
  Administered 2015-05-17: 15 mg via INTRAVENOUS
  Filled 2015-05-17: qty 1

## 2015-05-17 MED ORDER — METHOCARBAMOL 750 MG PO TABS
1500.0000 mg | ORAL_TABLET | Freq: Four times a day (QID) | ORAL | Status: DC
  Administered 2015-05-17 – 2015-05-20 (×13): 1500 mg via ORAL
  Filled 2015-05-17 (×15): qty 2

## 2015-05-17 MED ORDER — OXYCODONE HCL 5 MG PO TABS
5.0000 mg | ORAL_TABLET | ORAL | Status: DC | PRN
  Administered 2015-05-17: 15 mg via ORAL
  Filled 2015-05-17: qty 3

## 2015-05-17 MED ORDER — OXYCODONE HCL 5 MG PO TABS
10.0000 mg | ORAL_TABLET | ORAL | Status: DC | PRN
  Administered 2015-05-17 – 2015-05-18 (×3): 20 mg via ORAL
  Administered 2015-05-18: 10 mg via ORAL
  Administered 2015-05-18 (×2): 20 mg via ORAL
  Administered 2015-05-18: 10 mg via ORAL
  Administered 2015-05-19 (×2): 20 mg via ORAL
  Administered 2015-05-19: 10 mg via ORAL
  Administered 2015-05-20 (×2): 15 mg via ORAL
  Administered 2015-05-20: 10 mg via ORAL
  Administered 2015-05-20 – 2015-05-21 (×3): 15 mg via ORAL
  Administered 2015-05-21: 20 mg via ORAL
  Filled 2015-05-17: qty 3
  Filled 2015-05-17: qty 4
  Filled 2015-05-17: qty 3
  Filled 2015-05-17: qty 4
  Filled 2015-05-17: qty 3
  Filled 2015-05-17: qty 4
  Filled 2015-05-17: qty 2
  Filled 2015-05-17: qty 3
  Filled 2015-05-17 (×3): qty 4
  Filled 2015-05-17 (×2): qty 2
  Filled 2015-05-17 (×4): qty 4
  Filled 2015-05-17: qty 3

## 2015-05-17 MED ORDER — TAMSULOSIN HCL 0.4 MG PO CAPS
0.4000 mg | ORAL_CAPSULE | Freq: Every day | ORAL | Status: DC
  Administered 2015-05-17 – 2015-05-20 (×4): 0.4 mg via ORAL
  Filled 2015-05-17 (×4): qty 1
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
## 2015-05-17 NOTE — Progress Notes (Signed)
Orthopedic Tech Progress Note Patient Details:  Jermanie Minshall Sep 17, 1985 [id number]  CPM Right Knee CPM Right Knee: On Right Knee Flexion (Degrees): 30 Right Knee Extension (Degrees): 0   Gerard Cantara 05/17/2015, 1:59 PM

## 2015-05-17 NOTE — Progress Notes (Signed)
Pt stable Pain better controlled Plan or sat/sun for left foot cpm for right knee tomorrow 0 - 30
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
## 2015-05-17 NOTE — Progress Notes (Signed)
Orthopedic Tech Progress Note Patient Details:  Rigby Leonhardt Nov 14, 1985 [id number]  Patient ID: Lendon Colonel, male   DOB: March 06, 1986, 30 y.o.   MRN: [id number] Placed pt's rle on cpm  30 degrees   Nikki Dom 05/17/2015, 2:19 PM
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
## 2015-05-17 NOTE — Progress Notes (Signed)
OT Cancellation Note  Patient Details Name: Stokes Rattigan MRN: [id number] DOB: 26-Dec-1985   Cancelled Treatment:    Reason Eval/Treat Not Completed: Other (comment) Pt on CPM. C/o pain. Requesting OT return later. Will see in am. [facility] Joshue Badal, OTR/L  [phone] 05/17/2015 05/17/2015, 3:25 PM
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
## 2015-05-18 ENCOUNTER — Inpatient Hospital Stay (HOSPITAL_COMMUNITY): Payer: Worker's Compensation | Admitting: Certified Registered Nurse Anesthetist

## 2015-05-18 ENCOUNTER — Encounter (HOSPITAL_COMMUNITY): Payer: Self-pay | Admitting: Certified Registered Nurse Anesthetist

## 2015-05-18 ENCOUNTER — Encounter (HOSPITAL_COMMUNITY): Admission: EM | Disposition: A | Discharge status: Rehabilitation Facility | Payer: Self-pay | Source: Home / Self Care

## 2015-05-18 HISTORY — PX: FASCIOTOMY: SHX132

## 2015-05-18 HISTORY — PX: ORIF TOE FRACTURE: SHX5032

## 2015-05-18 LAB — CBC
HCT: 29.6 % — ABNORMAL LOW (ref 39.0–52.0)
Hemoglobin: 10 g/dL — ABNORMAL LOW (ref 13.0–17.0)
MCH: 29.3 pg (ref 26.0–34.0)
MCHC: 33.8 g/dL (ref 30.0–36.0)
MCV: 86.8 fL (ref 78.0–100.0)
PLATELETS: 156 10*3/uL (ref 150–400)
RBC: 3.41 MIL/uL — AB (ref 4.22–5.81)
RDW: 11.8 % (ref 11.5–15.5)
WBC: 6.7 10*3/uL (ref 4.0–10.5)

## 2015-05-18 SURGERY — OPEN REDUCTION INTERNAL FIXATION (ORIF) METATARSAL (TOE) FRACTURE
Anesthesia: General | Site: Foot | Laterality: Left

## 2015-05-18 MED ORDER — DEXAMETHASONE SODIUM PHOSPHATE 10 MG/ML IJ SOLN
INTRAMUSCULAR | Status: DC | PRN
  Administered 2015-05-18: 10 mg via INTRAVENOUS

## 2015-05-18 MED ORDER — SODIUM CHLORIDE 0.9 % IR SOLN
Status: DC | PRN
  Administered 2015-05-18: 3000 mL

## 2015-05-18 MED ORDER — ARTIFICIAL TEARS OP OINT
TOPICAL_OINTMENT | OPHTHALMIC | Status: AC
  Filled 2015-05-18: qty 3.5

## 2015-05-18 MED ORDER — METHOCARBAMOL 500 MG PO TABS
500.0000 mg | ORAL_TABLET | Freq: Four times a day (QID) | ORAL | Status: DC | PRN
  Administered 2015-05-19: 500 mg via ORAL
  Filled 2015-05-18 (×2): qty 1

## 2015-05-18 MED ORDER — BUPIVACAINE HCL (PF) 0.25 % IJ SOLN
INTRAMUSCULAR | Status: AC
  Filled 2015-05-18: qty 30

## 2015-05-18 MED ORDER — CEFAZOLIN SODIUM-DEXTROSE 2-3 GM-% IV SOLR
2.0000 g | INTRAVENOUS | Status: AC
  Administered 2015-05-18: 2 g via INTRAVENOUS
  Filled 2015-05-18 (×2): qty 50

## 2015-05-18 MED ORDER — METOCLOPRAMIDE HCL 5 MG/ML IJ SOLN: 5.0000 mg | Freq: Three times a day (TID) | INTRAMUSCULAR | Status: DC | PRN

## 2015-05-18 MED ORDER — FENTANYL CITRATE (PF) 100 MCG/2ML IJ SOLN
INTRAMUSCULAR | Status: DC | PRN
  Administered 2015-05-18 (×3): 50 ug via INTRAVENOUS
  Administered 2015-05-18: 100 ug via INTRAVENOUS

## 2015-05-18 MED ORDER — SODIUM CHLORIDE 0.9 % IV SOLN: INTRAVENOUS | Status: DC

## 2015-05-18 MED ORDER — LACTATED RINGERS IV SOLN
INTRAVENOUS | Status: DC | PRN
  Administered 2015-05-18 (×2): via INTRAVENOUS

## 2015-05-18 MED ORDER — ONDANSETRON HCL 4 MG PO TABS: 4.0000 mg | ORAL_TABLET | Freq: Four times a day (QID) | ORAL | Status: DC | PRN

## 2015-05-18 MED ORDER — DEXAMETHASONE SODIUM PHOSPHATE 10 MG/ML IJ SOLN
INTRAMUSCULAR | Status: AC
  Filled 2015-05-18: qty 1

## 2015-05-18 MED ORDER — SODIUM CHLORIDE 0.45 % IV SOLN: INTRAVENOUS | Status: DC

## 2015-05-18 MED ORDER — LACTATED RINGERS IV SOLN
INTRAVENOUS | Status: DC
  Administered 2015-05-18: 09:00:00 via INTRAVENOUS

## 2015-05-18 MED ORDER — PROPOFOL 10 MG/ML IV BOLUS
INTRAVENOUS | Status: DC | PRN
  Administered 2015-05-18: 150 mg via INTRAVENOUS

## 2015-05-18 MED ORDER — CEFAZOLIN SODIUM-DEXTROSE 2-3 GM-% IV SOLR
2.0000 g | Freq: Four times a day (QID) | INTRAVENOUS | Status: AC
  Administered 2015-05-18 – 2015-05-19 (×3): 2 g via INTRAVENOUS
  Filled 2015-05-18 (×3): qty 50

## 2015-05-18 MED ORDER — BUPIVACAINE HCL (PF) 0.25 % IJ SOLN
INTRAMUSCULAR | Status: DC | PRN
  Administered 2015-05-18: 10 mL

## 2015-05-18 MED ORDER — METHOCARBAMOL 1000 MG/10ML IJ SOLN
500.0000 mg | Freq: Four times a day (QID) | INTRAVENOUS | Status: DC | PRN
  Filled 2015-05-18: qty 5

## 2015-05-18 MED ORDER — ACETAMINOPHEN 650 MG RE SUPP: 650.0000 mg | Freq: Four times a day (QID) | RECTAL | Status: DC | PRN

## 2015-05-18 MED ORDER — FENTANYL CITRATE (PF) 250 MCG/5ML IJ SOLN
INTRAMUSCULAR | Status: AC
  Filled 2015-05-18: qty 5

## 2015-05-18 MED ORDER — METOCLOPRAMIDE HCL 5 MG PO TABS: 5.0000 mg | ORAL_TABLET | Freq: Three times a day (TID) | ORAL | Status: DC | PRN

## 2015-05-18 MED ORDER — 0.9 % SODIUM CHLORIDE (POUR BTL) OPTIME
TOPICAL | Status: DC | PRN
  Administered 2015-05-18: 1000 mL

## 2015-05-18 MED ORDER — MIDAZOLAM HCL 2 MG/2ML IJ SOLN
INTRAMUSCULAR | Status: AC
  Filled 2015-05-18: qty 2

## 2015-05-18 MED ORDER — ONDANSETRON HCL 4 MG/2ML IJ SOLN: 4.0000 mg | Freq: Four times a day (QID) | INTRAMUSCULAR | Status: DC | PRN

## 2015-05-18 MED ORDER — ONDANSETRON HCL 4 MG/2ML IJ SOLN
INTRAMUSCULAR | Status: AC
  Filled 2015-05-18: qty 2

## 2015-05-18 MED ORDER — ONDANSETRON HCL 4 MG/2ML IJ SOLN
INTRAMUSCULAR | Status: DC | PRN
  Administered 2015-05-18: 4 mg via INTRAVENOUS

## 2015-05-18 MED ORDER — ONDANSETRON HCL 4 MG/2ML IJ SOLN
4.0000 mg | Freq: Once | INTRAMUSCULAR | Status: AC
  Administered 2015-05-18: 4 mg via INTRAVENOUS
  Filled 2015-05-18: qty 2

## 2015-05-18 MED ORDER — HYDROMORPHONE HCL 1 MG/ML IJ SOLN
0.2500 mg | INTRAMUSCULAR | Status: DC | PRN
  Administered 2015-05-18 (×2): 0.5 mg via INTRAVENOUS

## 2015-05-18 MED ORDER — ONDANSETRON HCL 4 MG/2ML IJ SOLN
INTRAMUSCULAR | Status: AC
  Filled 2015-05-18: qty 4

## 2015-05-18 MED ORDER — HYDROMORPHONE HCL 1 MG/ML IJ SOLN
INTRAMUSCULAR | Status: AC
  Administered 2015-05-18: 0.5 mg via INTRAVENOUS
  Filled 2015-05-18: qty 1

## 2015-05-18 MED ORDER — ACETAMINOPHEN 325 MG PO TABS: 650.0000 mg | ORAL_TABLET | Freq: Four times a day (QID) | ORAL | Status: DC | PRN

## 2015-05-18 MED ORDER — LIDOCAINE HCL (CARDIAC) 20 MG/ML IV SOLN
INTRAVENOUS | Status: DC | PRN
  Administered 2015-05-18 (×2): 50 mg via INTRAVENOUS

## 2015-05-18 SURGICAL SUPPLY — 85 items
BANDAGE ELASTIC 4 VELCRO ST LF (GAUZE/BANDAGES/DRESSINGS) ×4 IMPLANT
BANDAGE ELASTIC 6 VELCRO ST LF (GAUZE/BANDAGES/DRESSINGS) ×4 IMPLANT
BIT DRILL CANN 3.2 (BIT) ×2 IMPLANT
BIT DRILL LCP QC 2X140 (BIT) ×8 IMPLANT
BLADE AVERAGE 25MMX9MM (BLADE)
BLADE AVERAGE 25X9 (BLADE) IMPLANT
BLADE MINI RND TIP GREEN BEAV (BLADE) IMPLANT
BNDG COHESIVE 1X5 TAN STRL LF (GAUZE/BANDAGES/DRESSINGS) IMPLANT
BNDG COHESIVE 4X5 TAN STRL (GAUZE/BANDAGES/DRESSINGS) ×4 IMPLANT
BNDG COHESIVE 6X5 TAN STRL LF (GAUZE/BANDAGES/DRESSINGS) IMPLANT
BNDG ESMARK 4X9 LF (GAUZE/BANDAGES/DRESSINGS) ×4 IMPLANT
BNDG GAUZE ELAST 4 BULKY (GAUZE/BANDAGES/DRESSINGS) ×4 IMPLANT
BNDG GAUZE STRTCH 6 (GAUZE/BANDAGES/DRESSINGS) IMPLANT
CANISTER WOUND CARE 500ML ATS (WOUND CARE) IMPLANT
CORDS BIPOLAR (ELECTRODE) ×4 IMPLANT
COTTON STERILE ROLL (GAUZE/BANDAGES/DRESSINGS) IMPLANT
COVER SURGICAL LIGHT HANDLE (MISCELLANEOUS) ×8 IMPLANT
CUFF TOURNIQUET SINGLE 18IN (TOURNIQUET CUFF) IMPLANT
CUFF TOURNIQUET SINGLE 24IN (TOURNIQUET CUFF) IMPLANT
CUFF TOURNIQUET SINGLE 34IN LL (TOURNIQUET CUFF) IMPLANT
CUFF TOURNIQUET SINGLE 44IN (TOURNIQUET CUFF) IMPLANT
DRAPE INCISE IOBAN 66X45 STRL (DRAPES) ×4 IMPLANT
DRAPE OEC MINIVIEW 54X84 (DRAPES) ×4 IMPLANT
DRAPE ORTHO SPLIT 77X108 STRL (DRAPES) ×4
DRAPE SURG ORHT 6 SPLT 77X108 (DRAPES) ×4 IMPLANT
DRAPE U-SHAPE 47X51 STRL (DRAPES) ×4 IMPLANT
DRILL BIT CANN 3.2 (BIT) ×4
DRSG ADAPTIC 3X8 NADH LF (GAUZE/BANDAGES/DRESSINGS) ×4 IMPLANT
DRSG MEPILEX BORDER 4X8 (GAUZE/BANDAGES/DRESSINGS) ×4 IMPLANT
DRSG PAD ABDOMINAL 8X10 ST (GAUZE/BANDAGES/DRESSINGS) ×4 IMPLANT
DRSG VAC ATS LRG SENSATRAC (GAUZE/BANDAGES/DRESSINGS) IMPLANT
DRSG VAC ATS MED SENSATRAC (GAUZE/BANDAGES/DRESSINGS) IMPLANT
DRSG VAC ATS SM SENSATRAC (GAUZE/BANDAGES/DRESSINGS) ×4 IMPLANT
DURAPREP 26ML APPLICATOR (WOUND CARE) ×4 IMPLANT
ELECT REM PT RETURN 9FT ADLT (ELECTROSURGICAL) ×4
ELECTRODE REM PT RTRN 9FT ADLT (ELECTROSURGICAL) ×2 IMPLANT
GAUZE SPONGE 2X2 8PLY STRL LF (GAUZE/BANDAGES/DRESSINGS) IMPLANT
GAUZE SPONGE 4X4 12PLY STRL (GAUZE/BANDAGES/DRESSINGS) ×4 IMPLANT
GLOVE BIOGEL PI IND STRL 6 (GLOVE) ×2 IMPLANT
GLOVE BIOGEL PI IND STRL 9 (GLOVE) ×2 IMPLANT
GLOVE BIOGEL PI INDICATOR 6 (GLOVE) ×2
GLOVE BIOGEL PI INDICATOR 9 (GLOVE) ×2
GLOVE SURG ORTHO 9.0 STRL STRW (GLOVE) ×4 IMPLANT
GOWN STRL REUS W/ TWL XL LVL3 (GOWN DISPOSABLE) ×6 IMPLANT
GOWN STRL REUS W/TWL XL LVL3 (GOWN DISPOSABLE) ×6
GUIDEWIRE NON THREAD 1.6MM (WIRE) ×4 IMPLANT
HANDPIECE INTERPULSE COAX TIP (DISPOSABLE) ×2
KIT BASIN OR (CUSTOM PROCEDURE TRAY) ×4 IMPLANT
KIT ROOM TURNOVER OR (KITS) ×4 IMPLANT
MANIFOLD NEPTUNE II (INSTRUMENTS) ×4 IMPLANT
NEEDLE HYPO 25GX1X1/2 BEV (NEEDLE) IMPLANT
NS IRRIG 1000ML POUR BTL (IV SOLUTION) ×4 IMPLANT
PACK GENERAL/GYN (CUSTOM PROCEDURE TRAY) ×4 IMPLANT
PACK ORTHO EXTREMITY (CUSTOM PROCEDURE TRAY) ×4 IMPLANT
PAD ARMBOARD 7.5X6 YLW CONV (MISCELLANEOUS) ×8 IMPLANT
PAD CAST 4YDX4 CTTN HI CHSV (CAST SUPPLIES) IMPLANT
PADDING CAST COTTON 4X4 STRL (CAST SUPPLIES)
PLATE FUSION VA LCP TMT (Plate) ×4 IMPLANT
PREVENA INCISION MGT 90 150 (MISCELLANEOUS) ×4 IMPLANT
SCREW 2.7X36MM (Screw) ×2 IMPLANT
SCREW 2.7X40MM (Screw) ×4 IMPLANT
SCREW CORTEX 2.7X26MM (Screw) ×4 IMPLANT
SCREW CORTEX 2.7X30 (Screw) ×12 IMPLANT
SCREW CORTEX 2.7X36 ST T8 (Screw) ×2 IMPLANT
SET HNDPC FAN SPRY TIP SCT (DISPOSABLE) ×2 IMPLANT
SET MONITOR QUICK PRESSURE (MISCELLANEOUS) IMPLANT
SPECIMEN JAR SMALL (MISCELLANEOUS) ×4 IMPLANT
SPONGE GAUZE 2X2 STER 10/PKG (GAUZE/BANDAGES/DRESSINGS)
SPONGE LAP 18X18 X RAY DECT (DISPOSABLE) ×4 IMPLANT
STAPLER VISISTAT 35W (STAPLE) IMPLANT
STOCKINETTE IMPERVIOUS 9X36 MD (GAUZE/BANDAGES/DRESSINGS) ×4 IMPLANT
SUCTION FRAZIER HANDLE 10FR (MISCELLANEOUS)
SUCTION TUBE FRAZIER 10FR DISP (MISCELLANEOUS) IMPLANT
SUT ETHILON 2 0 FSLX (SUTURE) IMPLANT
SUT ETHILON 2 0 PSLX (SUTURE) ×8 IMPLANT
SUT VIC AB 0 CTB1 27 (SUTURE) IMPLANT
SUT VIC AB 2-0 CT1 27 (SUTURE) ×2
SUT VIC AB 2-0 CT1 TAPERPNT 27 (SUTURE) ×2 IMPLANT
SUT VIC AB 2-0 CTB1 (SUTURE) IMPLANT
SYR CONTROL 10ML LL (SYRINGE) IMPLANT
TOWEL OR 17X24 6PK STRL BLUE (TOWEL DISPOSABLE) ×4 IMPLANT
TOWEL OR 17X26 10 PK STRL BLUE (TOWEL DISPOSABLE) ×4 IMPLANT
TUBE CONNECTING 12'X1/4 (SUCTIONS)
TUBE CONNECTING 12X1/4 (SUCTIONS) IMPLANT
WATER STERILE IRR 1000ML POUR (IV SOLUTION) ×4 IMPLANT

## 2015-05-18 NOTE — Progress Notes (Signed)
PT Cancellation Note  Patient Details Name: Gary Hebert MRN: [id number] DOB: 02-08-1986   Cancelled Treatment:    Reason Eval/Treat Not Completed: Patient at procedure or test/unavailable (pt in OR this am and unavailable)   [name] 05/18/2015, 8:22 AM [name], PT [phone]

## 2015-05-18 NOTE — Anesthesia Procedure Notes (Signed)
Procedure Name: LMA Insertion Date/Time: 05/18/2015 9:20 AM Performed by: Wray Kearns A Pre-anesthesia Checklist: Patient identified, Timeout performed, Emergency Drugs available, Suction available and Patient being monitored Patient Re-evaluated:Patient Re-evaluated prior to inductionOxygen Delivery Method: Circle system utilized Preoxygenation: Pre-oxygenation with 100% oxygen Intubation Type: IV induction Ventilation: Mask ventilation without difficulty LMA: LMA inserted LMA Size: 4.0 Tube type: Oral Number of attempts: 1 Placement Confirmation: breath sounds checked- equal and bilateral and positive ETCO2 Tube secured with: Tape Dental Injury: Teeth and Oropharynx as per pre-operative assessment

## 2015-05-18 NOTE — Transfer of Care (Signed)
Immediate Anesthesia Transfer of Care Note  Patient: Gary Hebert  Procedure(s) Performed: Procedure(s): OPEN REDUCTION INTERNAL FIXATION (ORIF) FOOT (Left) CLOSE FASCIOTOMIES (Left)  Patient Location: PACU  Anesthesia Type:General  Level of Consciousness: awake, oriented, sedated, patient cooperative and responds to stimulation  Airway & Oxygen Therapy: Patient Spontanous Breathing and Patient connected to nasal cannula oxygen  Post-op Assessment: Report given to RN, Post -op Vital signs reviewed and stable, Patient moving all extremities and Patient moving all extremities X 4  Post vital signs: Reviewed and stable  Last Vitals:  Filed Vitals:   05/17/15 2028 05/18/15 0631  BP: 126/65 112/51  Pulse: 91 67  Temp: 37.5 C 36.6 C  Resp:      Complications: No apparent anesthesia complications

## 2015-05-18 NOTE — Progress Notes (Signed)
Pt arrived from PACU drowsy but verbally responsive; IV intact and transfusing; LLE has mepilex dsg which is stain and marked; wound vac intact. Neuro check intact except pt c/o pain when toes are touched. Foley remains intact and unclamped. Family remains at pt bedside with call light within reach. Will continue to monitor. Dionne Bucy RN

## 2015-05-18 NOTE — Anesthesia Postprocedure Evaluation (Signed)
Anesthesia Post Note  Patient: Gary Hebert  Procedure(s) Performed: Procedure(s) (LRB): OPEN REDUCTION INTERNAL FIXATION (ORIF) FOOT (Left) CLOSE FASCIOTOMIES (Left)  Patient location during evaluation: PACU Anesthesia Type: General Level of consciousness: awake and alert Pain management: pain level controlled Vital Signs Assessment: post-procedure vital signs reviewed and stable Respiratory status: spontaneous breathing, nonlabored ventilation, respiratory function stable and patient connected to nasal cannula oxygen Cardiovascular status: blood pressure returned to baseline and stable Postop Assessment: no signs of nausea or vomiting Anesthetic complications: no    Last Vitals:  Filed Vitals:   05/18/15 1145 05/18/15 1200  BP: 135/75 131/69  Pulse: 107 95  Temp:  36.7 C  Resp: 18 10    Last Pain:  Filed Vitals:   05/18/15 1207  PainSc: 3                  Val[place] Ke,W. EDMOND

## 2015-05-18 NOTE — Progress Notes (Signed)
Patient reports that nausea is relieved. 

## 2015-05-18 NOTE — Anesthesia Preprocedure Evaluation (Addendum)
Anesthesia Evaluation  Patient identified by MRN, date of birth, ID band Patient awake    Reviewed: Allergy & Precautions, H&P , NPO status , Patient's Chart, lab work & pertinent test results  Airway Mallampati: III  TM Distance: >3 FB Neck ROM: Full    Dental no notable dental hx. (+) Teeth Intact, Dental Advisory Given   Pulmonary neg pulmonary ROS,    Pulmonary exam normal breath sounds clear to auscultation       Cardiovascular negative cardio ROS   Rhythm:Regular Rate:Normal     Neuro/Psych negative neurological ROS  negative psych ROS   GI/Hepatic negative GI ROS, Neg liver ROS,   Endo/Other  negative endocrine ROS  Renal/GU negative Renal ROS  negative genitourinary   Musculoskeletal   Abdominal   Peds  Hematology negative hematology ROS (+)   Anesthesia Other Findings   Reproductive/Obstetrics negative OB ROS                            Anesthesia Physical Anesthesia Plan  ASA: I  Anesthesia Plan: General   Post-op Pain Management:    Induction: Intravenous  Airway Management Planned: LMA  Additional Equipment:   Intra-op Plan:   Post-operative Plan: Extubation in OR  Informed Consent: I have reviewed the patients History and Physical, chart, labs and discussed the procedure including the risks, benefits and alternatives for the proposed anesthesia with the patient or authorized representative who has indicated his/her understanding and acceptance.   Dental advisory given  Plan Discussed with: CRNA  Anesthesia Plan Comments:         Anesthesia Quick Evaluation
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
## 2015-05-18 NOTE — Progress Notes (Signed)
OT Cancellation Note  Patient Details Name: Gary Hebert MRN: [id number] DOB: 1985/07/08   Cancelled Treatment:    Reason Eval/Treat Not Completed: Other (comment) Pt having surgery today. Will attempt tomorrow. [facility] Lonzy Mato, OTR/L  [phone] 05/18/2015 05/18/2015, 7:39 AM

## 2015-05-18 NOTE — Interval H&P Note (Signed)
History and Physical Interval Note:  05/18/2015 6:59 AM  Gary Hebert  has presented today for surgery, with the diagnosis of open fx left foot  The various methods of treatment have been discussed with the patient and family. After consideration of risks, benefits and other options for treatment, the patient has consented to  Procedure(s): OPEN REDUCTION INTERNAL FIXATION (ORIF) FOOT (Left) CLOSE FASCIOTOMIES (Left) as a surgical intervention .  The patient's history has been reviewed, patient examined, no change in status, stable for surgery.  I have reviewed the patient's chart and labs.  Questions were answered to the patient's satisfaction.     [name] V
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
## 2015-05-18 NOTE — Progress Notes (Signed)
Pt transported off unit to OR for procedure. P. Amo Aneya Daddona RN 

## 2015-05-19 MED ORDER — ENOXAPARIN SODIUM 40 MG/0.4ML ~~LOC~~ SOLN
40.0000 mg | SUBCUTANEOUS | Status: DC
  Administered 2015-05-19 – 2015-05-21 (×3): 40 mg via SUBCUTANEOUS
  Filled 2015-05-19 (×3): qty 0.4

## 2015-05-19 MED ORDER — MAGNESIUM CITRATE PO SOLN: 1.0000 | Freq: Every day | ORAL | Status: DC | PRN

## 2015-05-19 MED ORDER — BISACODYL 10 MG RE SUPP: 10.0000 mg | Freq: Every day | RECTAL | Status: DC | PRN
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
## 2015-05-19 NOTE — Progress Notes (Signed)
Orthopedic Tech Progress Note Patient Details:  Gary Hebert Dec 16, 1985 [id number]  Ortho Devices Type of Ortho Device: CAM walker Ortho Device/Splint Location: LLE Ortho Device/Splint Interventions: Application   Saul Fordyce 05/19/2015, 12:20 PM

## 2015-05-20 ENCOUNTER — Encounter (HOSPITAL_COMMUNITY): Payer: Self-pay | Admitting: Physical Medicine and Rehabilitation

## 2015-05-20 DIAGNOSIS — R339 Retention of urine, unspecified: Secondary | ICD-10-CM

## 2015-05-20 DIAGNOSIS — F411 Generalized anxiety disorder: Secondary | ICD-10-CM | POA: Insufficient documentation

## 2015-05-20 DIAGNOSIS — G8918 Other acute postprocedural pain: Secondary | ICD-10-CM | POA: Insufficient documentation

## 2015-05-20 DIAGNOSIS — D62 Acute posthemorrhagic anemia: Secondary | ICD-10-CM

## 2015-05-20 DIAGNOSIS — S82001S Unspecified fracture of right patella, sequela: Secondary | ICD-10-CM

## 2015-05-20 DIAGNOSIS — S022XXS Fracture of nasal bones, sequela: Secondary | ICD-10-CM

## 2015-05-20 DIAGNOSIS — S92302S Fracture of unspecified metatarsal bone(s), left foot, sequela: Secondary | ICD-10-CM

## 2015-05-20 DIAGNOSIS — S82009A Unspecified fracture of unspecified patella, initial encounter for closed fracture: Secondary | ICD-10-CM

## 2015-05-20 DIAGNOSIS — S72401S Unspecified fracture of lower end of right femur, sequela: Secondary | ICD-10-CM

## 2015-05-20 DIAGNOSIS — F1010 Alcohol abuse, uncomplicated: Secondary | ICD-10-CM

## 2015-05-20 DIAGNOSIS — S060X9S Concussion with loss of consciousness of unspecified duration, sequela: Secondary | ICD-10-CM

## 2015-05-20 DIAGNOSIS — S36039S Unspecified laceration of spleen, sequela: Secondary | ICD-10-CM

## 2015-05-20 DIAGNOSIS — S2220XS Unspecified fracture of sternum, sequela: Secondary | ICD-10-CM
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
## 2015-05-20 NOTE — Progress Notes (Signed)
Patient called for pain medication. Asked if he has voided and he said no, he believes that, if he is able to have a bowel movement, the urine will come with it.  Pain medication with bowel regimen were administered per MAR.

## 2015-05-20 NOTE — Progress Notes (Signed)
Routine safety check was done. Patient was in deep asleep.

## 2015-05-20 NOTE — Progress Notes (Signed)
Patient indicated that he was feeling pressure and he needed to void. In and out was performed and he voided 600 ML.
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
## 2015-05-20 NOTE — Progress Notes (Signed)
Rehab admissions - Evaluated for possible admission.  I met with patient and his wife.  They would like inpatient rehab prior to home with wife.  I will contact workers comp Tourist information centre manager and get rehab stay authorized.  Hopefully I can admit to inpatient rehab tomorrow.  Call me for questions.  #[phone]
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
## 2015-05-20 NOTE — Progress Notes (Signed)
According to patient, his foley was D/C around 1 pm and he has not voided since then. Bladder scan was done and it indicated 600 ml. In/Out was done and he voided 700 ml. He is currently on Flomax and it was administered per order.

## 2015-05-20 NOTE — Progress Notes (Signed)
Patient ID: Gary Hebert, male   DOB: 05/26/1985, 30 y.o.   MRN: [id number] Patient making slow progress with physical therapy. Anticipate patient will need inpatient rehabilitation
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
## 2015-05-21 ENCOUNTER — Encounter (HOSPITAL_COMMUNITY): Payer: Self-pay | Admitting: *Deleted

## 2015-05-21 ENCOUNTER — Inpatient Hospital Stay (HOSPITAL_COMMUNITY)
Admission: RE | Admit: 2015-05-21 | Discharge: 2015-05-30 | DRG: 949 | Disposition: A | Discharge status: Home Health | Payer: Worker's Compensation | Source: Intra-hospital | Attending: Physical Medicine & Rehabilitation | Admitting: Physical Medicine & Rehabilitation

## 2015-05-21 DIAGNOSIS — S069X3S Unspecified intracranial injury with loss of consciousness of 1 hour to 5 hours 59 minutes, sequela: Secondary | ICD-10-CM | POA: Diagnosis not present

## 2015-05-21 DIAGNOSIS — S92352D Displaced fracture of fifth metatarsal bone, left foot, subsequent encounter for fracture with routine healing: Secondary | ICD-10-CM

## 2015-05-21 DIAGNOSIS — S72491S Other fracture of lower end of right femur, sequela: Secondary | ICD-10-CM | POA: Diagnosis not present

## 2015-05-21 DIAGNOSIS — S92302A Fracture of unspecified metatarsal bone(s), left foot, initial encounter for closed fracture: Secondary | ICD-10-CM | POA: Diagnosis present

## 2015-05-21 DIAGNOSIS — S7290XA Unspecified fracture of unspecified femur, initial encounter for closed fracture: Secondary | ICD-10-CM | POA: Insufficient documentation

## 2015-05-21 DIAGNOSIS — S82001S Unspecified fracture of right patella, sequela: Secondary | ICD-10-CM

## 2015-05-21 DIAGNOSIS — K5901 Slow transit constipation: Secondary | ICD-10-CM | POA: Insufficient documentation

## 2015-05-21 DIAGNOSIS — S92322D Displaced fracture of second metatarsal bone, left foot, subsequent encounter for fracture with routine healing: Secondary | ICD-10-CM | POA: Diagnosis not present

## 2015-05-21 DIAGNOSIS — S72434D Nondisplaced fracture of medial condyle of right femur, subsequent encounter for closed fracture with routine healing: Secondary | ICD-10-CM | POA: Diagnosis not present

## 2015-05-21 DIAGNOSIS — S069X1D Unspecified intracranial injury with loss of consciousness of 30 minutes or less, subsequent encounter: Secondary | ICD-10-CM

## 2015-05-21 DIAGNOSIS — S069XAA Unspecified intracranial injury with loss of consciousness status unknown, initial encounter: Secondary | ICD-10-CM | POA: Diagnosis present

## 2015-05-21 DIAGNOSIS — S060X9D Concussion with loss of consciousness of unspecified duration, subsequent encounter: Principal | ICD-10-CM

## 2015-05-21 DIAGNOSIS — R52 Pain, unspecified: Secondary | ICD-10-CM

## 2015-05-21 DIAGNOSIS — S82041D Displaced comminuted fracture of right patella, subsequent encounter for closed fracture with routine healing: Secondary | ICD-10-CM | POA: Diagnosis not present

## 2015-05-21 DIAGNOSIS — G8918 Other acute postprocedural pain: Secondary | ICD-10-CM | POA: Insufficient documentation

## 2015-05-21 DIAGNOSIS — F4323 Adjustment disorder with mixed anxiety and depressed mood: Secondary | ICD-10-CM | POA: Diagnosis not present

## 2015-05-21 DIAGNOSIS — S72401A Unspecified fracture of lower end of right femur, initial encounter for closed fracture: Secondary | ICD-10-CM | POA: Diagnosis present

## 2015-05-21 DIAGNOSIS — S92342D Displaced fracture of fourth metatarsal bone, left foot, subsequent encounter for fracture with routine healing: Secondary | ICD-10-CM

## 2015-05-21 DIAGNOSIS — S82001D Unspecified fracture of right patella, subsequent encounter for closed fracture with routine healing: Secondary | ICD-10-CM

## 2015-05-21 DIAGNOSIS — S2220XD Unspecified fracture of sternum, subsequent encounter for fracture with routine healing: Secondary | ICD-10-CM | POA: Diagnosis not present

## 2015-05-21 DIAGNOSIS — M84453A Pathological fracture, unspecified femur, initial encounter for fracture: Secondary | ICD-10-CM | POA: Diagnosis not present

## 2015-05-21 DIAGNOSIS — R339 Retention of urine, unspecified: Secondary | ICD-10-CM | POA: Diagnosis present

## 2015-05-21 DIAGNOSIS — S72401D Unspecified fracture of lower end of right femur, subsequent encounter for closed fracture with routine healing: Secondary | ICD-10-CM

## 2015-05-21 DIAGNOSIS — D62 Acute posthemorrhagic anemia: Secondary | ICD-10-CM

## 2015-05-21 DIAGNOSIS — R0602 Shortness of breath: Secondary | ICD-10-CM | POA: Insufficient documentation

## 2015-05-21 DIAGNOSIS — S36039D Unspecified laceration of spleen, subsequent encounter: Secondary | ICD-10-CM

## 2015-05-21 DIAGNOSIS — S022XXD Fracture of nasal bones, subsequent encounter for fracture with routine healing: Secondary | ICD-10-CM

## 2015-05-21 DIAGNOSIS — S92302S Fracture of unspecified metatarsal bone(s), left foot, sequela: Secondary | ICD-10-CM

## 2015-05-21 DIAGNOSIS — M62838 Other muscle spasm: Secondary | ICD-10-CM

## 2015-05-21 DIAGNOSIS — S92332D Displaced fracture of third metatarsal bone, left foot, subsequent encounter for fracture with routine healing: Secondary | ICD-10-CM | POA: Diagnosis not present

## 2015-05-21 DIAGNOSIS — S72401S Unspecified fracture of lower end of right femur, sequela: Secondary | ICD-10-CM | POA: Diagnosis not present

## 2015-05-21 DIAGNOSIS — S069X9A Unspecified intracranial injury with loss of consciousness of unspecified duration, initial encounter: Secondary | ICD-10-CM | POA: Diagnosis present

## 2015-05-21 DIAGNOSIS — S82001A Unspecified fracture of right patella, initial encounter for closed fracture: Secondary | ICD-10-CM | POA: Diagnosis present

## 2015-05-21 LAB — CBC
HCT: 32.3 % — ABNORMAL LOW (ref 39.0–52.0)
HEMOGLOBIN: 11.3 g/dL — AB (ref 13.0–17.0)
MCH: 30.8 pg (ref 26.0–34.0)
MCHC: 35 g/dL (ref 30.0–36.0)
MCV: 88 fL (ref 78.0–100.0)
PLATELETS: 255 10*3/uL (ref 150–400)
RBC: 3.67 MIL/uL — AB (ref 4.22–5.81)
RDW: 11.9 % (ref 11.5–15.5)
WBC: 6.7 10*3/uL (ref 4.0–10.5)

## 2015-05-21 MED ORDER — BISACODYL 10 MG RE SUPP: 10.0000 mg | Freq: Every day | RECTAL | Status: DC | PRN

## 2015-05-21 MED ORDER — ACETAMINOPHEN 325 MG PO TABS: 325.0000 mg | ORAL_TABLET | ORAL | Status: DC | PRN

## 2015-05-21 MED ORDER — ENOXAPARIN SODIUM 40 MG/0.4ML ~~LOC~~ SOLN
40.0000 mg | SUBCUTANEOUS | Status: DC
  Administered 2015-05-22 – 2015-05-24 (×3): 40 mg via SUBCUTANEOUS
  Filled 2015-05-21 (×3): qty 0.4

## 2015-05-21 MED ORDER — DIAZEPAM 2 MG PO TABS
2.0000 mg | ORAL_TABLET | Freq: Three times a day (TID) | ORAL | Status: DC | PRN
  Administered 2015-05-21: 2 mg via ORAL
  Filled 2015-05-21: qty 1

## 2015-05-21 MED ORDER — ONDANSETRON HCL 4 MG/2ML IJ SOLN: 4.0000 mg | Freq: Four times a day (QID) | INTRAMUSCULAR | Status: DC | PRN

## 2015-05-21 MED ORDER — POLYETHYLENE GLYCOL 3350 17 G PO PACK
17.0000 g | PACK | Freq: Every day | ORAL | Status: DC
  Administered 2015-05-22 – 2015-05-25 (×4): 17 g via ORAL
  Filled 2015-05-21 (×4): qty 1

## 2015-05-21 MED ORDER — METHOCARBAMOL 500 MG PO TABS
1000.0000 mg | ORAL_TABLET | Freq: Four times a day (QID) | ORAL | Status: DC | PRN
  Administered 2015-05-21 – 2015-05-23 (×6): 1000 mg via ORAL
  Filled 2015-05-21 (×6): qty 2

## 2015-05-21 MED ORDER — BETHANECHOL CHLORIDE 25 MG PO TABS
25.0000 mg | ORAL_TABLET | Freq: Four times a day (QID) | ORAL | Status: DC
  Administered 2015-05-21 – 2015-05-28 (×26): 25 mg via ORAL
  Filled 2015-05-21 (×26): qty 1

## 2015-05-21 MED ORDER — DIAZEPAM 5 MG PO TABS: 5.0000 mg | ORAL_TABLET | Freq: Every evening | ORAL | Status: DC | PRN

## 2015-05-21 MED ORDER — ENOXAPARIN SODIUM 30 MG/0.3ML ~~LOC~~ SOLN: 30.0000 mg | SUBCUTANEOUS | Status: DC

## 2015-05-21 MED ORDER — OXYCODONE HCL 5 MG PO TABS
10.0000 mg | ORAL_TABLET | ORAL | Status: DC | PRN
  Administered 2015-05-21 – 2015-05-30 (×31): 10 mg via ORAL
  Filled 2015-05-21 (×32): qty 2

## 2015-05-21 MED ORDER — TAMSULOSIN HCL 0.4 MG PO CAPS: 0.8000 mg | ORAL_CAPSULE | Freq: Every day | ORAL | Status: DC

## 2015-05-21 MED ORDER — SENNOSIDES-DOCUSATE SODIUM 8.6-50 MG PO TABS: 1.0000 | ORAL_TABLET | Freq: Every evening | ORAL | Status: DC | PRN

## 2015-05-21 MED ORDER — BACLOFEN 5 MG HALF TABLET
5.0000 mg | ORAL_TABLET | Freq: Three times a day (TID) | ORAL | Status: DC | PRN
  Administered 2015-05-21: 5 mg via ORAL
  Filled 2015-05-21: qty 1

## 2015-05-21 MED ORDER — TAMSULOSIN HCL 0.4 MG PO CAPS
0.8000 mg | ORAL_CAPSULE | Freq: Every day | ORAL | Status: DC
Start: 2015-05-22 — End: 2015-05-27
  Administered 2015-05-22 – 2015-05-26 (×5): 0.8 mg via ORAL
  Filled 2015-05-21 (×5): qty 2

## 2015-05-21 MED ORDER — FLEET ENEMA 7-19 GM/118ML RE ENEM: 1.0000 | ENEMA | Freq: Once | RECTAL | Status: DC | PRN

## 2015-05-21 MED ORDER — ALUM & MAG HYDROXIDE-SIMETH 200-200-20 MG/5ML PO SUSP
30.0000 mL | ORAL | Status: DC | PRN
  Administered 2015-05-24: 30 mL via ORAL
  Filled 2015-05-21: qty 30

## 2015-05-21 MED ORDER — ONDANSETRON HCL 4 MG PO TABS: 4.0000 mg | ORAL_TABLET | Freq: Four times a day (QID) | ORAL | Status: DC | PRN

## 2015-05-21 MED ORDER — TRAMADOL HCL 50 MG PO TABS
100.0000 mg | ORAL_TABLET | Freq: Four times a day (QID) | ORAL | Status: DC
  Administered 2015-05-21 – 2015-05-28 (×27): 100 mg via ORAL
  Filled 2015-05-21 (×27): qty 2

## 2015-05-21 MED ORDER — DOCUSATE SODIUM 100 MG PO CAPS
100.0000 mg | ORAL_CAPSULE | Freq: Two times a day (BID) | ORAL | Status: DC
  Administered 2015-05-21 – 2015-05-23 (×4): 100 mg via ORAL
  Filled 2015-05-21 (×4): qty 1

## 2015-05-21 MED ORDER — HYDROMORPHONE HCL 1 MG/ML IJ SOLN: 0.5000 mg | INTRAMUSCULAR | Status: DC | PRN

## 2015-05-21 MED ORDER — GUAIFENESIN-DM 100-10 MG/5ML PO SYRP: 5.0000 mL | ORAL_SOLUTION | Freq: Four times a day (QID) | ORAL | Status: DC | PRN

## 2015-05-21 MED ORDER — METHOCARBAMOL 500 MG PO TABS
1000.0000 mg | ORAL_TABLET | Freq: Four times a day (QID) | ORAL | Status: DC | PRN
  Administered 2015-05-21 (×2): 1000 mg via ORAL
  Filled 2015-05-21 (×2): qty 2
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
## 2015-05-21 NOTE — Progress Notes (Signed)
Patient ID: Gary Hebert, male   DOB: 21-Dec-1985, 30 y.o.   MRN: [id number] Patient arrived from [facility] with RN, family, and belongings. Patient oriented to room, nurse call system, rehab schedule, fall safety plan, rehab safety plan and health resource notebook. Patient received pain medication prior to arrival on inpatient rehab. Patient resting comfortably in bed with call bell and family at the bed side. Continue with plan of care.
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
## 2015-05-21 NOTE — Progress Notes (Signed)
Rehab admissions - I have approval from workers comp for acute inpatient rehab admission.  Bed available and will admit to inpatient rehab today.  Call me for questions.  #[phone]

## 2015-05-21 NOTE — Progress Notes (Signed)
Report called to [facility] at 4W, all questions/cocnerns answered. Pt will be transported to 4W23. Will continue to monitor
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
## 2015-05-21 NOTE — Progress Notes (Signed)
Pt still unable to void after a total of 4 in and out cath per patient and family request. Multiple attempts with out success. Patient agreed to indwelling foley. Foley inserted without difficulty. Immediately got back of clear yellow urine. Will continue to monitor.
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
## 2015-05-21 NOTE — Progress Notes (Signed)
Orthopedic Tech Progress Note Patient Details:  Gary Hebert [birth year] [id number] Patient did not want to stay on cpm and was noncompliant. CPM Right Knee CPM Right Knee: On Right Knee Flexion (Degrees): 30 Right Knee Extension (Degrees): 0   Jennye Moccasin 05/21/2015, 10:23 PM

## 2015-05-22 ENCOUNTER — Inpatient Hospital Stay (HOSPITAL_COMMUNITY): Payer: Worker's Compensation | Admitting: Occupational Therapy

## 2015-05-22 ENCOUNTER — Encounter (HOSPITAL_COMMUNITY): Payer: Self-pay | Admitting: Orthopedic Surgery

## 2015-05-22 ENCOUNTER — Inpatient Hospital Stay (HOSPITAL_COMMUNITY): Payer: Self-pay | Admitting: Physical Therapy

## 2015-05-22 ENCOUNTER — Inpatient Hospital Stay (HOSPITAL_COMMUNITY): Payer: Worker's Compensation | Admitting: Speech Pathology

## 2015-05-22 DIAGNOSIS — D62 Acute posthemorrhagic anemia: Secondary | ICD-10-CM

## 2015-05-22 LAB — COMPREHENSIVE METABOLIC PANEL
ALK PHOS: 54 U/L (ref 38–126)
ALT: 18 U/L (ref 17–63)
AST: 28 U/L (ref 15–41)
Albumin: 3.1 g/dL — ABNORMAL LOW (ref 3.5–5.0)
Anion gap: 9 (ref 5–15)
BILIRUBIN TOTAL: 0.6 mg/dL (ref 0.3–1.2)
BUN: 10 mg/dL (ref 6–20)
CALCIUM: 9 mg/dL (ref 8.9–10.3)
CO2: 26 mmol/L (ref 22–32)
Chloride: 104 mmol/L (ref 101–111)
Creatinine, Ser: 0.92 mg/dL (ref 0.61–1.24)
GFR calc Af Amer: >60 mL/min (ref >60)
GLUCOSE: 104 mg/dL — AB (ref 65–99)
POTASSIUM: 4.2 mmol/L (ref 3.5–5.1)
Sodium: 139 mmol/L (ref 135–145)
TOTAL PROTEIN: 5.8 g/dL — AB (ref 6.5–8.1)

## 2015-05-22 LAB — URINALYSIS, ROUTINE W REFLEX MICROSCOPIC
Bilirubin Urine: NEGATIVE
GLUCOSE, UA: NEGATIVE mg/dL
HGB URINE DIPSTICK: NEGATIVE
KETONES UR: NEGATIVE mg/dL
LEUKOCYTES UA: NEGATIVE
Nitrite: NEGATIVE
Protein, ur: NEGATIVE mg/dL
Specific Gravity, Urine: 1.009 (ref 1.005–1.030)
pH: 7.5 (ref 5.0–8.0)

## 2015-05-22 LAB — CBC WITH DIFFERENTIAL/PLATELET
BASOS PCT: 1 %
Basophils Absolute: 0.1 10*3/uL (ref 0.0–0.1)
EOS ABS: 0.1 10*3/uL (ref 0.0–0.7)
Eosinophils Relative: 2 %
HEMATOCRIT: 34.1 % — AB (ref 39.0–52.0)
HEMOGLOBIN: 11.3 g/dL — AB (ref 13.0–17.0)
LYMPHS ABS: 2.4 10*3/uL (ref 0.7–4.0)
Lymphocytes Relative: 36 %
MCH: 29.3 pg (ref 26.0–34.0)
MCHC: 33.1 g/dL (ref 30.0–36.0)
MCV: 88.3 fL (ref 78.0–100.0)
MONO ABS: 0.8 10*3/uL (ref 0.1–1.0)
MONOS PCT: 12 %
NEUTROS ABS: 3.4 10*3/uL (ref 1.7–7.7)
Neutrophils Relative %: 49 %
Platelets: 255 10*3/uL (ref 150–400)
RBC: 3.86 MIL/uL — ABNORMAL LOW (ref 4.22–5.81)
RDW: 11.9 % (ref 11.5–15.5)
WBC: 6.8 10*3/uL (ref 4.0–10.5)

## 2015-05-22 MED ORDER — LORAZEPAM 0.5 MG PO TABS
0.5000 mg | ORAL_TABLET | Freq: Two times a day (BID) | ORAL | Status: DC | PRN
  Administered 2015-05-22 – 2015-05-24 (×4): 0.5 mg via ORAL
  Filled 2015-05-22 (×4): qty 1

## 2015-05-22 NOTE — Progress Notes (Signed)
Speech Language Pathology Daily Session Note  Patient Details  Name: Gary Hebert MRN: [id number] Date of Birth: 03-03-1986  Today's Date: 05/22/2015 SLP Individual Time: 7829-5621 SLP Individual Time Calculation (min): 30 min  Short Term Goals: Week 1: SLP Short Term Goal 1 (Week 1): short term goals= long term goals   Skilled Therapeutic Interventions: Skilled treatment session focused on cognitive goals. SLP facilitated session by providing external aids to maximize patient's recall of his current pain medications and to record when PRN medications were given so he can independently anticipate and request medications appropriately. Patient utilized external aids with Mod I. Patient's wife present throughout session and verbalized understanding of all information presented. Patient also recalled events from previous therapy sessions with Mod I. Patient left supine in bed with all needs within reach and family present. Continue with current plan of care.    Function:  Cognition Comprehension Comprehension assist level: Follows basic conversation/direction with no assist  Expression   Expression assist level: Expresses complex ideas: With extra time/assistive device  Social Interaction Social Interaction assist level: Interacts appropriately with others with medication or extra time (anti-anxiety, antidepressant).  Problem Solving Problem solving assist level: Solves complex 90% of the time/cues < 10% of the time  Memory Memory assist level: Recognizes or recalls 90% of the time/requires cueing < 10% of the time    Pain No reports of pain while supine in bed   Therapy/Group: Individual Therapy  Sencere Symonette 05/22/2015, 4:01 PM
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
## 2015-05-22 NOTE — Progress Notes (Signed)
Patient information reviewed and entered into [facility] System by Becky Dabid [place], covering PPS coordinator. Information including medical coding and functional independence measure will be reviewed and updated through discharge.   
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
## 2015-05-23 ENCOUNTER — Inpatient Hospital Stay (HOSPITAL_COMMUNITY): Payer: Worker's Compensation | Admitting: Physical Therapy

## 2015-05-23 ENCOUNTER — Inpatient Hospital Stay (HOSPITAL_COMMUNITY): Payer: Worker's Compensation

## 2015-05-23 ENCOUNTER — Inpatient Hospital Stay (HOSPITAL_COMMUNITY): Payer: Worker's Compensation | Admitting: Speech Pathology

## 2015-05-23 ENCOUNTER — Inpatient Hospital Stay (HOSPITAL_COMMUNITY): Payer: Worker's Compensation | Admitting: Occupational Therapy

## 2015-05-23 ENCOUNTER — Encounter (HOSPITAL_COMMUNITY): Payer: Self-pay | Admitting: Orthopedic Surgery

## 2015-05-23 DIAGNOSIS — F4323 Adjustment disorder with mixed anxiety and depressed mood: Secondary | ICD-10-CM | POA: Insufficient documentation

## 2015-05-23 LAB — URINE CULTURE

## 2015-05-23 MED ORDER — METHOCARBAMOL 500 MG PO TABS
1000.0000 mg | ORAL_TABLET | Freq: Four times a day (QID) | ORAL | Status: DC
  Administered 2015-05-23 – 2015-05-28 (×20): 1000 mg via ORAL
  Filled 2015-05-23 (×20): qty 2

## 2015-05-23 MED ORDER — ESCITALOPRAM OXALATE 10 MG PO TABS
5.0000 mg | ORAL_TABLET | Freq: Every day | ORAL | Status: DC
  Administered 2015-05-23 – 2015-05-30 (×8): 5 mg via ORAL
  Filled 2015-05-23 (×8): qty 1

## 2015-05-23 MED ORDER — BACLOFEN 5 MG HALF TABLET: 5.0000 mg | ORAL_TABLET | Freq: Three times a day (TID) | ORAL | Status: DC

## 2015-05-23 MED ORDER — SENNOSIDES-DOCUSATE SODIUM 8.6-50 MG PO TABS
2.0000 | ORAL_TABLET | Freq: Every day | ORAL | Status: DC
  Administered 2015-05-23 – 2015-05-24 (×2): 2 via ORAL
  Filled 2015-05-23 (×2): qty 2

## 2015-05-23 MED ORDER — BACLOFEN 5 MG HALF TABLET
5.0000 mg | ORAL_TABLET | Freq: Three times a day (TID) | ORAL | Status: DC | PRN
  Administered 2015-05-23 – 2015-05-24 (×2): 5 mg via ORAL
  Filled 2015-05-23 (×2): qty 1

## 2015-05-23 NOTE — Progress Notes (Signed)
Occupational Therapy Session Note  Patient Details  Name: Gary Hebert MRN: [id number] Date of Birth: Nov 26, 1985  Today's Date: 05/23/2015 OT Individual Time: 1400-1430 OT Individual Time Calculation (min): 30 min    Short Term Goals: Week 1:  OT Short Term Goal 1 (Week 1): STGs = LTGs  Skilled Therapeutic Interventions/Progress Updates: ADL-retraining with focus on slide board transfer, seated grooming, pain management, and adherence to weight-bearing precautions.   Pt received supine in bed, HOB, but receptive for transfer to w/c to perform grooming at sink.   Pt required setup assist to manage bedding and steadying assist during transfer with min vc for technique.   Pt groomed at sink after setup to provide supplies.   Pt re-educated on use of leg rests to improve independence with managing throbbing pain; ice packs provided at end of session.  Pt elected to remain in w/c at end of session.     Therapy Documentation Precautions:  Precautions Precautions: Fall Required Braces or Orthoses: Knee Immobilizer - Right, Other Brace/Splint Knee Immobilizer - Right: On at all times Other Brace/Splint: CAM boot LLE Restrictions Weight Bearing Restrictions: Yes RLE Weight Bearing: Weight bearing as tolerated LLE Weight Bearing: Partial weight bearing  Pain: Pain Assessment Pain Assessment: 0-10 Pain Score: 2  Pain Type: Acute pain Pain Location: Foot Pain Orientation: Left Pain Descriptors / Indicators: Shooting Pain Frequency: Intermittent Pain Onset: Unable to tell Patients Stated Pain Goal: 3 Pain Intervention(s): Medication (See eMAR)  ADL: ADL ADL Comments: Refer to functional navigator   See Function Navigator for Current Functional Status.   Therapy/Group: Individual Therapy  Anum Palecek 05/23/2015, 2:41 PM

## 2015-05-23 NOTE — Care Management Note (Signed)
[facility] Individual Statement of Services  Patient Name:  Gary Hebert  Date:  05/23/2015  Welcome to the [facility]patient [facility].  Our goal is to provide you with an individualized program based on your diagnosis and situation, designed to meet your specific needs.  With this comprehensive rehabilitation program, you will be expected to participate in at least 3 hours of rehabilitation therapies Monday-Friday, with modified therapy programming on the weekends.  Your rehabilitation program will include the following services:  Physical Therapy (PT), Occupational Therapy (OT), Speech Therapy (ST), 24 hour per day rehabilitation nursing, Therapeutic Recreaction (TR), Neuropsychology, Case Management (Social Worker), Rehabilitation Medicine, Nutrition Services and Pharmacy Services  Weekly team conferences will be held on Wednesdays to discuss your progress.  Your Social Worker will talk with you frequently to get your input and to update you on team discussions.  Team conferences with you and your family in attendance may also be held.  Expected length of stay: 8-10 days  Overall anticipated outcome: minimal assistance  Depending on your progress and recovery, your program may change. Your Social Worker will coordinate services and will keep you informed of any changes. Your Social Worker's name and contact numbers are listed  below.  The following services may also be recommended but are not provided by the Inpatient Rehabilitation [facility]:   Driving Evaluations  Home Health Rehabiltiation Services  Outpatient Rehabilitation Services  Vocational Rehabilitation   Arrangements will be made to provide these services after discharge if needed.  Arrangements include referral to agencies that provide these services.  Your insurance has been verified to be:  Worker's Comp Your primary doctor is:  Dr. Lois Huxley  Pertinent information will be shared with your  doctor and your insurance company.  Social Worker:  [place], [place] [phone] or ([phone]   Information discussed with and copy given to patient by: Amada Jupiter, 05/23/2015, 4:48 PM
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
## 2015-05-23 NOTE — Progress Notes (Signed)
Occupational Therapy Session Note  Patient Details  Name: Gary Hebert MRN: [id number] Date of Birth: 1986/01/26  Today's Date: 05/23/2015 OT Individual Time: 4098-1191 OT Individual Time Calculation (min): 114 min    Short Term Goals: Week 1:  OT Short Term Goal 1 (Week 1): STGs = LTGs  Skilled Therapeutic Interventions/Progress Updates:  Upon entering the room, pt supine in bed with 4/10 c/o pain this session in L LE. His wife is present in the room for education. Skilled OT intervention with focus on self care retraining, functional transfers, pt/family education, and coping strategies. Pt very anxious throughout session with medication being given by RN. Pt required min verbal cues and demonstration for pursed lip breathing throughout session. Pt sat EOB for bathing and dressing tasks with R LE propped for comfort. Pt performing lateral leans on bed to clean buttocks and pull pants over B hips. Pt encouraged to try lateral scoot but pt refused. Caregiver and pt demonstrated slide board transfer from bed >wheelchair with supervision. His wife, [name], was checked off for safety and correctly demonstrating ability to assist pt to transfer. Pt returning to bed at end of session secondary to increased pain and pt is very restless. Pt began yelling out in pain and yelling at wife. After calming pt, [name] was very upset and OT spoke to her regarding caregiver stress, coping strategies for situation, and education regarding progress towards interventions related to goals and discharge. Caregiver reports feeling much better. Pt supine in bed with call bell and all needed items within reach upon exiting the room.   Therapy Documentation Precautions:  Precautions Precautions: Fall Required Braces or Orthoses: Knee Immobilizer - Right, Other Brace/Splint Knee Immobilizer - Right: On at all times Other Brace/Splint: CAM boot LLE Restrictions Weight Bearing Restrictions: Yes RLE Weight Bearing:  Weight bearing as tolerated LLE Weight Bearing: Partial weight bearing Vital Signs: Therapy Vitals Temp: 97.9 F (36.6 C) Temp Source: Oral Pulse Rate: 88 Resp: 18 BP: 113/65 mmHg Patient Position (if appropriate): Lying Oxygen Therapy SpO2: 99 % O2 Device: Not Delivered Pain: Pain Assessment Pain Assessment: 0-10 Pain Score: Asleep Pain Type: Acute pain Pain Location: Foot Pain Orientation: Left Pain Descriptors / Indicators: Aching Pain Frequency: Constant Pain Onset: On-going Patients Stated Pain Goal: 3 Pain Intervention(s): Medication (See eMAR) ADL: ADL ADL Comments: Refer to functional navigator  See Function Navigator for Current Functional Status.   Therapy/Group: Individual Therapy  Lowella Grip 05/23/2015, 9:13 AM
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
## 2015-05-23 NOTE — Progress Notes (Signed)
Speech Language Pathology Daily Session Note  Patient Details  Name: Gary Hebert MRN: [id number] Date of Birth: 03-11-1986  Today's Date: 05/23/2015 SLP Individual Time: 1500-1530 SLP Individual Time Calculation (min): 30 min  Short Term Goals: Week 1: SLP Short Term Goal 1 (Week 1): short term goals= long term goals   Skilled Therapeutic Interventions: Skilled treatment session focused on cognitive goals. Patient propelled his wheelchair to and from the SLP office with Mod I. Patient was also able to verbally direct his care in regards to sequencing steps for a slideboard transfer from the bed to the wheelchair with Mod I and recalled events from morning therapy sessions with Mod I. Patient left upright in wheelchair with all needs within reach. Continue with current plan of care.   Function:   Cognition Comprehension Comprehension assist level: Follows basic conversation/direction with no assist  Expression   Expression assist level: Expresses complex ideas: With extra time/assistive device  Social Interaction Social Interaction assist level: Interacts appropriately with others with medication or extra time (anti-anxiety, antidepressant).  Problem Solving Problem solving assist level: Solves complex problems: With extra time  Memory Memory assist level: More than reasonable amount of time    Pain Intermittent pain in right leg, patient repositioned and premedicated  Therapy/Group: Individual Therapy  Trajon Rosete 05/23/2015, 3:44 PM

## 2015-05-24 ENCOUNTER — Inpatient Hospital Stay (HOSPITAL_COMMUNITY): Payer: Worker's Compensation

## 2015-05-24 ENCOUNTER — Inpatient Hospital Stay (HOSPITAL_COMMUNITY): Payer: Self-pay | Admitting: Occupational Therapy

## 2015-05-24 ENCOUNTER — Inpatient Hospital Stay (HOSPITAL_COMMUNITY): Payer: Self-pay | Admitting: Physical Therapy

## 2015-05-24 ENCOUNTER — Inpatient Hospital Stay (HOSPITAL_COMMUNITY): Payer: Worker's Compensation | Admitting: Speech Pathology

## 2015-05-24 DIAGNOSIS — R0602 Shortness of breath: Secondary | ICD-10-CM | POA: Insufficient documentation

## 2015-05-24 DIAGNOSIS — K5901 Slow transit constipation: Secondary | ICD-10-CM

## 2015-05-24 MED ORDER — MAGNESIUM HYDROXIDE 400 MG/5ML PO SUSP
30.0000 mL | Freq: Every day | ORAL | Status: DC | PRN
  Filled 2015-05-24: qty 30

## 2015-05-24 MED ORDER — LIDOCAINE 5 % EX PTCH
1.0000 | MEDICATED_PATCH | CUTANEOUS | Status: DC
  Administered 2015-05-24: 1 via TRANSDERMAL
  Filled 2015-05-24 (×3): qty 1

## 2015-05-24 MED ORDER — ENOXAPARIN SODIUM 30 MG/0.3ML ~~LOC~~ SOLN
30.0000 mg | Freq: Two times a day (BID) | SUBCUTANEOUS | Status: DC
  Administered 2015-05-24 – 2015-05-30 (×11): 30 mg via SUBCUTANEOUS
  Filled 2015-05-24 (×12): qty 0.3

## 2015-05-24 MED ORDER — MAGNESIUM HYDROXIDE 400 MG/5ML PO SUSP: 30.0000 mL | Freq: Once | ORAL | Status: DC

## 2015-05-24 NOTE — Progress Notes (Signed)
Occupational Therapy Session Note  Patient Details  Name: Gary Hebert MRN: [id number] Date of Birth: 03-26-1985  Today's Date: 05/24/2015 OT Individual Time: 0902-1000 OT Individual Time Calculation (min): 58 min    Short Term Goals: Week 1:  OT Short Term Goal 1 (Week 1): STGs = LTGs  Skilled Therapeutic Interventions/Progress Updates:    Pt completed bathing and dressing sitting at the EOB.  Lateral leans for washing peri area and pulling shorts over hips.  Gave pt reacher to practice with threading and unthreading clothing over feet as he could not complete this.  Mod assist for use of reacher secondary to shorts getting caught on velcro tabs on CAM boot and KI.  Discussed with pt asking MD if either brace can be removed for bathing and dressing to make threading clothing easier.  Pt transferred to the wheelchair with supervision using the sliding board.  Therapist assisted with positioning LEs on elevating leg rests.  Grooming tasks completed with modified independence at the sink.  Therapist assisted with rinsing pt's hair as he worked on washing and drying it from wheelchair level.  Discussed with pt to let nursing also know about his PCM as well.  Per order he is supposed to be in it for 1 hour per shift.  He reports not being in it but one time since admission.  Attempted to let nursing know but they were off of the floor at this time.  Will follow-up.  Pt left in wheelchair with his mom present.  All items within reach.   Therapy Documentation Precautions:  Precautions Precautions: Fall Required Braces or Orthoses: Knee Immobilizer - Right, Other Brace/Splint Knee Immobilizer - Right: On at all times Other Brace/Splint: CAM boot LLE Restrictions Weight Bearing Restrictions: Yes RLE Weight Bearing: Weight bearing as tolerated LLE Weight Bearing: Non weight bearing   Pain: Pain Assessment Pain Assessment: 0-10 Pain Score: 3  Faces Pain Scale: Hurts little more Pain Type:  Acute pain Pain Location: Back Pain Orientation: Right Pain Descriptors / Indicators: Aching Pain Onset: With Activity (with deep inspiration) Pain Intervention(s): Medication (See eMAR) ADL: See Function Navigator for Current Functional Status.   Therapy/Group: Individual Therapy  Gary Hebert OTR/L 05/24/2015, 10:47 AM

## 2015-05-24 NOTE — Progress Notes (Signed)
Pt stable xrays ok right knee Patella feels stable Needs to work on full extension Needs cpm Need for trying to keep leg straight discussed

## 2015-05-24 NOTE — Progress Notes (Signed)
Social Work Patient ID: Gary ColonelJoshua Scott Hebert, male   DOB: 12/29/1985, [age] y.o.   MRN: [id number]   Have reviewed team conference information with pt/ wife and with Worker's Comp CM, Rodman PickleAmanda Ratliff.  All aware and agreeable with targeted d/c date of 3/9.  Have provided WC CM with order for home ramp so she will go ahead and work on this.  Will order the rest of his DME next week.  No further concerns at this time.  Champagne Paletta, LCSW
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
## 2015-05-24 NOTE — Progress Notes (Signed)
Physical Therapy Session Note  Patient Details  Name: Gary Hebert MRN: [id number] Date of Birth: 1985/05/04  Today's Date: 05/24/2015 PT Individual Time: 1300-1426 PT Individual Time Calculation (min): 86 min   Short Term Goals: Week 1:  PT Short Term Goal 1 (Week 1): =LTGs due to ELOS  Skilled Therapeutic Interventions/Progress Updates:    Pt received supine in bed, reports 6/10 pain in R knee.  Pt reports hearing pop in knee earlier today followed by pain; xray negative and surgeon cleared for OOB activity.  UE therex with level 3 theraband: 10 reps chest press, diagonal, bicep curl, tricep extension, and shoulder press all bilaterally.  PT instructed pt's family in bed mobility and providing supervision for safe slide board transfer from bed<>w/c.  Wife and mother accompanied to car and PT demonstrated car transfer with pt using slide board and answered all questions.  PT instructed pt and wife in w/c<>bed transfer with slide board in therapy apartment, demonstrating assist for RLE as needed and set-up of w/c.  Also discussed possible use of foam wedge to decrease discomfort in ribs when transitioning from supine<>sit.  Pt returned to room in w/c at end of session and positioned in w/c with call bell in reach and needs met.    Therapy Documentation Precautions:  Precautions Precautions: Fall Required Braces or Orthoses: Knee Immobilizer - Right, Other Brace/Splint Knee Immobilizer - Right: On at all times Other Brace/Splint: CAM boot LLE Restrictions Weight Bearing Restrictions: Yes RLE Weight Bearing: Weight bearing as tolerated LLE Weight Bearing: Non weight bearing   See Function Navigator for Current Functional Status.   Therapy/Group: Individual Therapy  Carime Dinkel E Penven-Crew 05/24/2015, 4:30 PM

## 2015-05-24 NOTE — Progress Notes (Signed)
Speech Language Pathology Session Note & Discharge Summary  Patient Details  Name: Gary Hebert MRN: [id number] Date of Birth: 06/08/1985  Today's Date: 05/24/2015 SLP Individual Time: 0830-0855 SLP Individual Time Calculation (min): 25 min   Skilled Therapeutic Interventions:  Skilled treatment session focused on patient and family education in regards to patient's current cognitive function and strategies to utilize at while here in the hospital and at home to maximize independence and reduce overall frustration level. Both were education on the importance of directing his care and advocating for himself to reduce overstimulation and possible frustration with family members. All verbalized understanding of information and reported no concerns at this time. Patient is at his cognitive baseline, therefore, he will be discharge from SLP caseload.   Patient has met 2 of 2 long term goals.  Patient to discharge at overall Modified Independent level.   Reasons goals not met: N/A    Clinical Impression/Discharge Summary:  Patient has made functional gains and has met 2 of 2 LTG's this admission. Currently, patient is demonstrating behaviors consistent with a Rancho Level VIII and is overall Mod I for complex problem solving and recall of new information. Patient and family education is complete. Both report the patient is at his cognitive baseline, therefore, patient will be discharged from SLP caseload and skilled f/u SLP services are not warranted at this time.   Care Partner:  Caregiver Able to Provide Assistance: Yes  Type of Caregiver Assistance: Physical  Recommendation:  None      Equipment: N/A    Reasons for discharge: Treatment goals met   Patient/Family Agrees with Progress Made and Goals Achieved: Yes   Function:  Cognition Comprehension Comprehension assist level: Follows complex conversation/direction with extra time/assistive device  Expression   Expression assist  level: Expresses complex ideas: With extra time/assistive device  Social Interaction Social Interaction assist level: Interacts appropriately with others with medication or extra time (anti-anxiety, antidepressant).  Problem Solving Problem solving assist level: Solves complex problems: With extra time  Memory Memory assist level: More than reasonable amount of time   PAYNE, COURTNEY 05/24/2015, 4:02 PM    

## 2015-05-24 NOTE — Progress Notes (Signed)
Physical Therapy Session Note  Patient Details  Name: Gary Hebert MRN: [id number] Date of Birth: [birth year]  Today's Date: 05/24/2015 PT Individual Time: 1050-1103 PT Individual Time Calculation (min): 13 min  and Today's Date: 05/24/2015 PT Missed Time: 17 Minutes Missed Time Reason: Toileting  Short Term Goals: Week 1:  PT Short Term Goal 1 (Week 1): =LTGs due to ELOS  Skilled Therapeutic Interventions/Progress Updates:    Pt missed time due to toileting and requesting privacy. Pt had already transferred back to w/c when therapist returned to room. Education with pt's mom on how to position and adjust legrests to assist with mobility and transfers. Engaged pt in functional w/c mobility on unit, over carpeted surface, and through obstacle course at modified independent level.   Therapy Documentation Precautions:  Precautions Precautions: Fall Required Braces or Orthoses: Knee Immobilizer - Right, Other Brace/Splint Knee Immobilizer - Right: On at all times Other Brace/Splint: CAM boot LLE Restrictions Weight Bearing Restrictions: Yes RLE Weight Bearing: Weight bearing as tolerated LLE Weight Bearing: Non weight bearing General: PT Amount of Missed Time (min): 17 Minutes PT Missed Treatment Reason: Toileting   Pain: Reports pain is ok right now.   See Function Navigator for Current Functional Status.   Therapy/Group: Individual Therapy  Karolee StampsGray, Abbi Mancini Darrol PokeBrescia  Honor Fairbank B. Vester Balthazor, PT, DPT  05/24/2015, 11:29 AM

## 2015-05-25 ENCOUNTER — Encounter (HOSPITAL_COMMUNITY): Payer: Self-pay

## 2015-05-25 ENCOUNTER — Inpatient Hospital Stay (HOSPITAL_COMMUNITY): Payer: Worker's Compensation | Admitting: Occupational Therapy

## 2015-05-25 ENCOUNTER — Inpatient Hospital Stay (HOSPITAL_COMMUNITY): Payer: Self-pay | Admitting: Physical Therapy

## 2015-05-25 MED ORDER — FLEET ENEMA 7-19 GM/118ML RE ENEM: 1.0000 | ENEMA | Freq: Every day | RECTAL | Status: DC | PRN

## 2015-05-25 MED ORDER — SENNOSIDES-DOCUSATE SODIUM 8.6-50 MG PO TABS
2.0000 | ORAL_TABLET | Freq: Two times a day (BID) | ORAL | Status: DC
  Administered 2015-05-25 – 2015-05-30 (×10): 2 via ORAL
  Filled 2015-05-25 (×10): qty 2

## 2015-05-25 MED ORDER — POLYETHYLENE GLYCOL 3350 17 G PO PACK
17.0000 g | PACK | Freq: Two times a day (BID) | ORAL | Status: DC
  Administered 2015-05-25 – 2015-05-30 (×10): 17 g via ORAL
  Filled 2015-05-25 (×10): qty 1

## 2015-05-25 NOTE — Progress Notes (Signed)
Patient refuses his CPM, he said he have been bending his knee and that he will put it on tomorrow patient was educated by RN but he continue to refused.
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
## 2015-05-25 NOTE — Progress Notes (Signed)
Occupational Therapy Session Note  Patient Details  Name: Gary Hebert Quant MRN: [id number] Date of Birth: 02-20-1986  Today's Date: 05/25/2015 OT Individual Time: [id number] OT Individual Time Calculation (min): 85 min    Short Term Goals: Week 1:  OT Short Term Goal 1 (Week 1): STGs = LTGs  Skilled Therapeutic Interventions/Progress Updates:  Upon entering the room, pt supine in bed with wife present in room but she leaves shortly after entering. Pt with 6/10 c/o pain in R knee during the session. Pain medication given by RN this session. Pt requested to wash while seated on EOB. Pt given instruction to verbally direct care and set up for self care tasks. Pt required assistance to manage R LE off of bed. He performed bathing tasks with lateral lean to the L and R to wash buttocks with supervision. OT and pt discussion options for bathing at home. However, pt is very indecisive in regards to decision making. OT introduced reacher and leg lifter this session in an attempt to increase I with LB self care. Pt able to don shorts over B feet with use of reacher with increased time. Pt performed slide board transfer and was able to place board with supervision and verbal cues for therapist for weight bearing restrictions. Once seated in wheelchair, pt performed grooming tasks while seated in wheelchair. Wife entered the room again at end of session and call bell and all needed items left within reach upon exiting the room.   Therapy Documentation Precautions:  Precautions Precautions: Fall Required Braces or Orthoses: Knee Immobilizer - Right, Other Brace/Splint Knee Immobilizer - Right: On at all times Other Brace/Splint: CAM boot LLE Restrictions Weight Bearing Restrictions: Yes RLE Weight Bearing: Weight bearing as tolerated LLE Weight Bearing: Non weight bearing ADL: ADL ADL Comments: Refer to functional navigator  See Function Navigator for Current Functional Status.   Therapy/Group:  Individual Therapy  [name], Znya Albino L 05/25/2015, 10:59 AM

## 2015-05-25 NOTE — Progress Notes (Signed)
Physical Therapy Session Note  Patient Details  Name: Gary Hebert MRN: [id number] Date of Birth: [birth year]  Today's Date: 05/25/2015 PT Individual Time: 0900-1000 and 1300-1345 PT Individual Time Calculation (min): 60 min and 45 min  Short Term Goals: Week 1:  PT Short Term Goal 1 (Week 1): =LTGs due to ELOS  Skilled Therapeutic Interventions/Progress Updates:   Pt received in w/c in a lot of discomfort in RLE; attempted to reposition but unable to relieve discomfort.  Pt reports being premedicated.  Pt performed w/c mobility to gym mod I x 150'.  Pt required assistance of therapist to perform w/c parts management and set up due to increased pain; pt able to place slideboard and perform slideboard transfer with mod A with therapist holding each LE elevated to prevent pushing through LLE and for comfort of RLE but pt eventually able to lower RLE and push for transfer through RLE due to pain in sternum.  Performed sit > supine with min A to bring RLE onto mat and for controlled lowering to supine.  Pt engaged in RLE strengthening and ROM exercises with focus on hip ABD, ADD, IR, flexion, knee extension with quad sets and ankle DF for ROM + L PROM to ankle into DF, eversion, inversion with CAM boot removed and then performed active hip ABD, ADD and flexion with CAM boot donned.  While performing exercises discussed with pt home modifications and lay out of house and measurements.  Pt and wife provided with handout to record all necessary measurements on it.  Pt asking about using BSC at night instead of going into the bathroom; discussed with pt option of [name] amount of assistance needed for LE management and slideboard.  Pt agreeable to try.  Performed supine > long sitting with mod A and performed pivoting and posterior transfer into w/c with min A to manage RLE during w/c parts management.  Pt returned to room and left in w/c with wife  present.    PM session:  Pt received in bed; reporting increased knee soreness this pm but premedicated.  Obtained for patient different manual w/c with increased seat depth and seat to floor height for improved positioning and comfort.  Pt performed transition from supine > long sitting and pivoted to perform posterior transfer with min A to manage RLE; performed posterior transfer into w/c with min A but extra time due to pain in sternum and pt leaning posterior to scoot posterior.  Educated pt on head-hips relationship and leaning forwards to minimize amount of strain on UE.  Pt performed w/c mobility in manual w/c and educated on more efficient w/c propulsion sequence to allow multiple revolutions before next propulsion to minimize pain/strain on UE.  Pt reporting need to urinate and have BM.  [name]; pt and wife demonstrated slideboard w/c > BSC with wife providing min A for LE positioning and management and pt requiring assistance to stabilize slideboard and to avoid WB through LLE.  Pt to finish toileting with wife and transition back to bed with wife.  Pt provided with gel ice packs to place on R knee once back in bed.  Therapy Documentation Precautions:  Precautions Precautions: Fall Required Braces or Orthoses: Knee Immobilizer - Right, Other Brace/Splint Knee Immobilizer - Right: On at all times Other Brace/Splint: CAM boot LLE Restrictions Weight Bearing Restrictions: Yes RLE Weight Bearing: Weight bearing as tolerated LLE Weight Bearing: Non weight bearing Pain: Pain Assessment  Faces Pain Scale: Hurts whole lot Pain Type: Acute pain Pain Location: Sternum Pain Orientation: Mid Pain Descriptors / Indicators: Sore Pain Onset: With Activity Pain Intervention(s): Rest  See Function Navigator for Current Functional Status.   Therapy/Group: Individual Therapy  [name] 05/25/2015, 12:28 PM

## 2015-05-26 ENCOUNTER — Inpatient Hospital Stay (HOSPITAL_COMMUNITY): Payer: Worker's Compensation

## 2015-05-26 ENCOUNTER — Encounter (HOSPITAL_COMMUNITY): Payer: Self-pay

## 2015-05-26 ENCOUNTER — Inpatient Hospital Stay (HOSPITAL_COMMUNITY): Payer: Worker's Compensation | Admitting: Occupational Therapy

## 2015-05-26 DIAGNOSIS — M84453A Pathological fracture, unspecified femur, initial encounter for fracture: Secondary | ICD-10-CM

## 2015-05-26 MED ORDER — SALINE SPRAY 0.65 % NA SOLN
1.0000 | NASAL | Status: DC | PRN
  Administered 2015-05-26: 1 via NASAL
  Filled 2015-05-26: qty 44

## 2015-05-26 NOTE — Progress Notes (Signed)
VASCULAR LAB PRELIMINARY  PRELIMINARY  PRELIMINARY  PRELIMINARY  Bilateral lower extremity venous duplex completed.    Preliminary report:  Bilateral:  No evidence of DVT, superficial thrombosis, or Baker's Cyst.   Yoshino Broccoli, RVS 05/26/2015, 4:47 PM
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
## 2015-05-26 NOTE — Progress Notes (Signed)
Occupational Therapy Session Note  Patient Details  Name: Gary Hebert MRN: [id number] Date of Birth: 1985/12/21  Today's Date: 05/26/2015 OT Individual Time: 1030-1100 OT Individual Time Calculation (min): 30 min    Short Term Goals: Week 1:  OT Short Term Goal 1 (Week 1): STGs = LTGs  Skilled Therapeutic Interventions/Progress Updates:    Treatment session with focus on functional transfers, education on slide board placement, and lateral leans.  Pt received in bed.  Completed bed mobility with supervision and use of bed rails.  Slide board transfer to drop arm BSC with min cues for appropriate placement of slide board.  Engaged in lateral leans to simulate doffing pants for toileting.  Slide board transfer into w/c from drop arm BSC with initial tactile cue for transfer then supervision during rest of transfer.  Pt completed grooming tasks from w/c level without assist, while discussing need to measure doorways and height of bed to further address mobility and transfers. Pt and wife reporting understanding and pt's father to measure today.  Therapy Documentation Precautions:  Precautions Precautions: Fall Required Braces or Orthoses: Knee Immobilizer - Right, Other Brace/Splint Knee Immobilizer - Right: On at all times Other Brace/Splint: CAM boot LLE Restrictions Weight Bearing Restrictions: Yes RLE Weight Bearing: Weight bearing as tolerated LLE Weight Bearing: Non weight bearing Pain:  Pt reports pain in Rt knee, RN aware. ADL: ADL ADL Comments: Refer to functional navigator  See Function Navigator for Current Functional Status.   Therapy/Group: Individual Therapy  Kagan Mutchler 05/26/2015, 11:02 AM

## 2015-05-27 ENCOUNTER — Encounter (HOSPITAL_COMMUNITY): Payer: Self-pay

## 2015-05-27 ENCOUNTER — Inpatient Hospital Stay (HOSPITAL_COMMUNITY): Payer: Worker's Compensation | Admitting: Occupational Therapy

## 2015-05-27 ENCOUNTER — Inpatient Hospital Stay (HOSPITAL_COMMUNITY): Payer: Worker's Compensation

## 2015-05-27 ENCOUNTER — Inpatient Hospital Stay (HOSPITAL_COMMUNITY): Payer: Self-pay | Admitting: Physical Therapy

## 2015-05-27 DIAGNOSIS — R52 Pain, unspecified: Secondary | ICD-10-CM | POA: Insufficient documentation

## 2015-05-27 NOTE — Progress Notes (Signed)
Occupational Therapy Session Note  Patient Details  Name: Gary Hebert MRN: [id number] Date of Birth: 11-16-1985  Today's Date: 05/27/2015 OT Individual Time: 1001-1059 and 1415 -1459 OT Individual Time Calculation (min): 58 min and 44 min    Short Term Goals: Week 1:  OT Short Term Goal 1 (Week 1): STGs = LTGs  Skilled Therapeutic Interventions/Progress Updates:  Session 1:Upon entering the room, pt supine in bed with 4/10 c/o pain in R LE this session. Pt performing slide board transfer onto drop arm commode chair.Pt able to safely maintain weight bearing restrictions throughout transfers. Pt engaged in lateral leans for LB bathing with set up for LB clothing management. Pt utilized reacher as needed to don pants over B LES. Pt did require assistance with donning B leg rest. He propelled wheelchair to sink for grooming tasks with mod I. Pt remained in wheelchair at end of session with call bell and all needed items within reach.   Session 2: Upon entering the room, pt supine in bed with  3/10 pain in chest this session. Pt premedicated prior to OT arrival. Pt performed slide board transfer by placing board himself and placing R LE onto block secondary to pain. Pt did keep NWB restriction for L LE during transfer as well. Pt performed transfer with supervision. Pt propelling wheelchair 300+ feet onto elevator and down to gift shop. Pt maneuvered wheelchair down aisle, tight corners, and over thresholds with mod I and increased time for task. Pt also maneuvering around chairs and tables in lobby area left by patrons with increased time. Pt returning to room and transferring back into bed at end of session for RN care. Call bell and all needed items within reach upon exiting the room.   Therapy Documentation Precautions:  Precautions Precautions: Fall Required Braces or Orthoses: Knee Immobilizer - Right, Other Brace/Splint Knee Immobilizer - Right: On at all times Other Brace/Splint: CAM  boot LLE Restrictions Weight Bearing Restrictions: Yes RLE Weight Bearing: Weight bearing as tolerated LLE Weight Bearing: Partial weight bearing   Pain: Pain Assessment Pain Assessment: No/denies pain Pain Score: 0-No pain ADL: ADL ADL Comments: Refer to functional navigator  See Function Navigator for Current Functional Status.   Therapy/Group: Individual Therapy  [name], Alekxander Isola L 05/27/2015, 12:37 PM
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
## 2015-05-27 NOTE — Progress Notes (Signed)
Occupational Therapy Note  Patient Details  Name: Lendon ColonelJoshua Scott Marasigan MRN: [id number] Date of Birth: Jan 19, 1986  Today's Date: 05/27/2015 OT Individual Time: 1130-1200 OT Individual Time Calculation (min): 30 min   Pt denied pain Individual Therapy  Pt resting in w/c upon arrival.  Pt initially engaged in grooming task (shaving) at sink prior to propelling to therapy gym.  Pt engaged in BUE therex on [place] for approx 7 mins.  Pt indicated that the activity aggravated/increased pain in sternum.  Pt propelled back to room.     Lavone NeriLanier, Edit [facility] 05/27/2015, 12:19 PM

## 2015-05-28 ENCOUNTER — Inpatient Hospital Stay (HOSPITAL_COMMUNITY): Payer: Self-pay | Admitting: Physical Therapy

## 2015-05-28 ENCOUNTER — Inpatient Hospital Stay (HOSPITAL_COMMUNITY): Payer: Worker's Compensation | Admitting: Occupational Therapy

## 2015-05-28 ENCOUNTER — Inpatient Hospital Stay (HOSPITAL_COMMUNITY): Payer: Worker's Compensation | Admitting: *Deleted

## 2015-05-28 ENCOUNTER — Encounter (HOSPITAL_COMMUNITY): Payer: Self-pay | Admitting: Orthopedic Surgery

## 2015-05-28 DIAGNOSIS — G8918 Other acute postprocedural pain: Secondary | ICD-10-CM | POA: Insufficient documentation

## 2015-05-28 DIAGNOSIS — S72499A Other fracture of lower end of unspecified femur, initial encounter for closed fracture: Secondary | ICD-10-CM

## 2015-05-28 DIAGNOSIS — S8290XS Unspecified fracture of unspecified lower leg, sequela: Secondary | ICD-10-CM

## 2015-05-28 LAB — CREATININE, SERUM: CREATININE: 0.93 mg/dL (ref 0.61–1.24)

## 2015-05-28 MED ORDER — BETHANECHOL CHLORIDE 10 MG PO TABS: 10.0000 mg | ORAL_TABLET | Freq: Four times a day (QID) | ORAL | Status: DC

## 2015-05-28 MED ORDER — TRAMADOL HCL 50 MG PO TABS: 100.0000 mg | ORAL_TABLET | Freq: Four times a day (QID) | ORAL | Status: DC | PRN

## 2015-05-28 MED ORDER — TRAMADOL HCL 50 MG PO TABS
50.0000 mg | ORAL_TABLET | Freq: Three times a day (TID) | ORAL | Status: DC
  Administered 2015-05-28 – 2015-05-30 (×7): 50 mg via ORAL
  Filled 2015-05-28 (×7): qty 1

## 2015-05-28 MED ORDER — TRAMADOL HCL 50 MG PO TABS
50.0000 mg | ORAL_TABLET | Freq: Four times a day (QID) | ORAL | Status: DC | PRN
  Administered 2015-05-28: 50 mg via ORAL
  Filled 2015-05-28: qty 1

## 2015-05-28 MED ORDER — ENOXAPARIN (LOVENOX) PATIENT EDUCATION KIT
PACK | Freq: Once | Status: DC
  Filled 2015-05-28: qty 1

## 2015-05-28 MED ORDER — METHOCARBAMOL 500 MG PO TABS
1000.0000 mg | ORAL_TABLET | Freq: Four times a day (QID) | ORAL | Status: DC | PRN
  Administered 2015-05-28 – 2015-05-29 (×3): 1000 mg via ORAL
  Filled 2015-05-28 (×3): qty 2

## 2015-05-28 MED ORDER — BETHANECHOL CHLORIDE 10 MG PO TABS
5.0000 mg | ORAL_TABLET | Freq: Four times a day (QID) | ORAL | Status: DC
  Administered 2015-05-28 – 2015-05-29 (×4): 5 mg via ORAL
  Filled 2015-05-28 (×4): qty 1

## 2015-05-28 NOTE — Progress Notes (Signed)
Social Work Patient ID: Gary Hebert, male   DOB: 09-09-1985, 30 y.o.   MRN: [id number]   Have spoken with pt and with Worker's Comp CM, Rodman PickleAmanda Ratliff.  Continue to plan toward 3/9 discharge and family ed to take place tomorrow starting at 3:00 with wife.  PA has complete wife's FMLA paperwork.  I have placed DME (including CPM) referrals with [facility] and still to make [facility].  WC CM reports ramp at home should be in place by d/c.  Continue to follow.  Mahdiya Mossberg, LCSW
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
## 2015-05-28 NOTE — Progress Notes (Signed)
Occupational Therapy Session Note  Patient Details  Name: Gary Hebert MRN: [id number] Date of Birth: 1985-05-20  Today's Date: 05/28/2015 OT Individual Time: [id number] OT Individual Time Calculation (min): 30 min    Skilled Therapeutic Interventions/Progress Updates:    Pt completed donning KI on the RLE in bed with mod assist for lifting the LE to place it, but he was able to complete all strapping with supervision.  He donned the left Cam boot as well but needed min assistance to fasten the last 2 velcro straps.  Once these were placed he transferred to the wheelchair with distant supervision using the sliding board.  Therapist assisted with positioning leg rests, then he rolled himself down to the therapy gym.  Worked on UE strengthening from wheelchair level in order to increase independence with functional transfers.  Utilized green medium resistant theraband for shoulder flexion, abduction, and extension bilaterally.  Pt completed 1 set of 15 repetitions for each UE for the stated movements with min instructional cueing for technique.  Once finished he rolled himself back to the room to rest for next session.  He maintained sitting in the wheelchair with call button and phone within reach.    Therapy Documentation Precautions:  Precautions Precautions: Fall Required Braces or Orthoses: Knee Immobilizer - Right, Other Brace/Splint Knee Immobilizer - Right: On at all times Other Brace/Splint: CAM boot LLE Restrictions Weight Bearing Restrictions: Yes RLE Weight Bearing: Weight bearing as tolerated LLE Weight Bearing: Non weight bearing   Pain: Pain Assessment Pain Assessment: 0-10 Pain Score: 8  Pain Type: Acute pain Pain Location: Knee Pain Orientation: Right Pain Descriptors / Indicators: Aching Pain Frequency: Intermittent Pain Onset: With Activity Patients Stated Pain Goal: 3 Pain Intervention(s): Medication (See eMAR)  See Function Navigator for Current Functional  Status.   Therapy/Group: Individual Therapy  Anguel Delapena OTR/L 05/28/2015, 4:27 PM
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
## 2015-05-28 NOTE — Progress Notes (Signed)
Occupational Therapy Session Note  Patient Details  Name: Gary Hebert MRN: [id number] Date of Birth: [birth year]  Today's Date: 05/28/2015 OT Individual Time: 1100-1155 OT Individual Time Calculation (min): 55 min    Short Term Goals: Week 1:  OT Short Term Goal 1 (Week 1): STGs = LTGs  Skilled Therapeutic Interventions/Progress Updates:  Upon entering the room, pt supine in bed with 4/10 c/o pain in chest and R knee this session. Pt agreeable to OT intervention. Pt coming to EOB with supervision. Slide board transfer to wheelchair with supervision and pt maintaining NWB on Hebert LE. Pt also able to self manage R LE and legs rests of wheelchair with increased time. Pt propelled wheelchair to ADL apartment. Pt and OT discussing problem solving ideas for slide board transfer to soft, plush couch like the one in his own home. Pt performed downhill slide board transfer with supervision. Pt requiring rest break while OT educated pt on energy conservation with self care tasks and general concepts of topic. Pt reports he is going to family get together and OT educated pt on things to consider in regards to energy conservation with this function. Pt verbalized understanding and engaged in conversation as appropriate. Pt performed uneven transfer back into wheelchair with pt bearing weight through R LE appropriately and close supervision.  Pt returned to room via wheelchair. Paper handout given for B UE strengthening with use of green theraband. Pt returned demonstrations of exercises for chest pulls, shoulder flexion, shoulder diagonals, elbow flexion, elbow extension, bicep curls, and alternating punches. Pt returned to bed at the end of session with supervision and use of slide board.   Therapy Documentation Precautions:  Precautions Precautions: Fall Required Braces or Orthoses: Knee Immobilizer - Right, Other Brace/Splint Knee Immobilizer - Right: On at all times Other Brace/Splint: CAM boot  LLE Restrictions Weight Bearing Restrictions: Yes RLE Weight Bearing: Weight bearing as tolerated LLE Weight Bearing: Non weight bearing General:   Vital Signs:  Pain: Pain Assessment Pain Assessment: 0-10 Pain Score: 5  Pain Type: Acute pain Pain Location: Foot Pain Orientation: Left Pain Descriptors / Indicators: Aching Pain Frequency: Intermittent Pain Onset: With Activity Patients Stated Pain Goal: 3 Pain Intervention(s): Medication (See eMAR) ADL: ADL ADL Comments: Refer to functional navigator Exercises:   Other Treatments:    See Function Navigator for Current Functional Status.   Therapy/Group: Individual Therapy  Gary Hebert, Gary Hebert 05/28/2015, 12:23 PM
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
## 2015-05-28 NOTE — Progress Notes (Signed)
Physical Therapy Session Note  Patient Details  Name: [name] MRN: [id number] Date of Birth: 1986-03-22  Today's Date: 05/27/2015 PT Individual Time: 1530-1630    PT Time Calculation: 60 min  Short Term Goals: Week 1:  PT Short Term Goal 1 (Week 1): =LTGs due to ELOS  Skilled Therapeutic Interventions/Progress Updates:    Pt received resting in bed with no c/o pain and agreeable to therapy session.  Session focus on lateral scoot transfers with slide board and LE therex.  PT provided total assist for donning L cam boot and R KI with pt supine.  Pt transitioned supine>sit with supervision, HOB elevated and using bedrails.  Lateral scoot from EOB>w/c with slide board, supervision with verbal cues for weight bearing through RLE and NWB through LLE.  Pt propelled w/c to therapy gym and transferred to therapy mat with slide board supervision.  Pt requires assist for management of w/c leg rests 2/2 pain and KI on RLE.  PT instructed pt in BLE therex x15-20 reps ankle pumps (RLE only), quad sets, glute sets, hip abd/add to midline (AAROM), and SLR (AAROM on R, AROM on L).  PT provided PROM heel cord stretch on RLE, 3x30 seconds.  Pt returned to w/c with supervision and propelled back to room.  Discussed home entry with door widths provided by pt's family.  Pt left in w/c in room with call bell in reach and needs met.   Therapy Documentation Precautions:  Precautions Precautions: Fall Required Braces or Orthoses: Knee Immobilizer - Right, Other Brace/Splint Knee Immobilizer - Right: On at all times Other Brace/Splint: CAM boot LLE Restrictions Weight Bearing Restrictions: Yes RLE Weight Bearing: Weight bearing as tolerated LLE Weight Bearing: Non weight bearing General:   Vital Signs: Therapy Vitals Temp: 98.3 F (36.8 C) Temp Source: Oral Pulse Rate: 67 Resp: 16 BP: (!) 115/58 mmHg Patient Position (if appropriate): Lying Oxygen Therapy SpO2: 100 % O2 Device: Not  Delivered Pain: Pain Assessment Pain Assessment: 0-10 Pain Score: 3  Pain Type: Acute pain Pain Location: Foot Pain Orientation: Left Pain Descriptors / Indicators: Aching Pain Frequency: Intermittent Pain Onset: With Activity Patients Stated Pain Goal: 3 Pain Intervention(s): Medication (See eMAR) Mobility:   Locomotion :    Trunk/Postural Assessment :    Balance:   Exercises:   Other Treatments:     See Function Navigator for Current Functional Status.   Therapy/Group: Individual Therapy  Earnest Conroy Penven-Crew 05/28/2015, 8:34 AM

## 2015-05-28 NOTE — Progress Notes (Addendum)
Physical Therapy Session Note  Patient Details  Name: Gary Hebert MRN: [id number] Date of Birth: 04/06/1985  Today's Date: 05/28/2015 PT Individual Time: 900-953 and 1530-1620 PT time Calculation: 53 min and 50 min       Short Term Goals: Week 1:  PT Short Term Goal 1 (Week 1): =LTGs due to ELOS  Skilled Therapeutic Interventions/Progress Updates:    Session 1: pt received resting in bed with CPM in place, no c/o pain, and agreeable to therapy.  PT assisted with donning L cam boot and R KI.  Supine>sit and lateral scoot to w/c with supervision. Pt demos improved ability to maintain NWB on LLE throughout entirety of transfer.  PT instructed pt in management of w/c parts including brakes, armrests, and leg rests.  Pt able to don/doff w/c leg rests with pillows for support on RLE with supervision and verbal cues.  Community w/c mobility >500' mod I and pt demonstrated negotiation of >3% grade ramp, varying surface textures, doorways, etc.  Pt returned to room at end of session.  PROM stretching to bilat ankles and AAROM ankle pumps on L.  Pt left in w/c at end of session with call bell in reach and needs met.   Session 2: Pt received resting in w/c, reporting increased pain in sternum from UE exercise today, but agreeable to therapy session.  Session focus on pt education, recall of previous exercises, LE therex, and transfers.  Pt demonstrated UE exercises performed with OT earlier today (level 3 therband x8 reps).  Slide board transfer to therapy mat from w/c with supervision.  Sit>supine with supervision.  Pt performed x20 reps bilat LE therex ankle pumps, quad sets, glute sets, hip abd/add to midline, and SLR (AAROM on RLE).  PT provided pt education on pressure relief when in w/c and pt verbalized understanding.  Pt reporting increased pain in sternum and R knee following therex and requesting to return to room.  Supine>sit from therapy mat with min assist for RLE, and slide board transfer to  w/c with supervision.  Pt able to manage w/c parts with increased time.  Pt returned to room and performed slide board transfer back to bed with supervision.  PT positioned ice for pt over knee and on sternum for pain control.  RN alerted to pain level.  Pt left supine in bed with call bell in reach and needs met.   Therapy Documentation Precautions:  Precautions Precautions: Fall Required Braces or Orthoses: Knee Immobilizer - Right, Other Brace/Splint Knee Immobilizer - Right: On at all times Other Brace/Splint: CAM boot LLE Restrictions Weight Bearing Restrictions: Yes RLE Weight Bearing: Weight bearing as tolerated LLE Weight Bearing: Non weight bearing   See Function Navigator for Current Functional Status.   Therapy/Group: Individual Therapy  Knut Rondinelli E Penven-Crew 05/28/2015, 10:58 AM

## 2015-05-29 ENCOUNTER — Ambulatory Visit (HOSPITAL_COMMUNITY): Payer: Self-pay | Admitting: Physical Therapy

## 2015-05-29 ENCOUNTER — Inpatient Hospital Stay (HOSPITAL_COMMUNITY): Payer: Worker's Compensation | Admitting: Occupational Therapy

## 2015-05-29 ENCOUNTER — Inpatient Hospital Stay (HOSPITAL_COMMUNITY): Payer: Self-pay | Admitting: Physical Therapy

## 2015-05-29 ENCOUNTER — Encounter (HOSPITAL_COMMUNITY): Payer: Self-pay | Admitting: Orthopedic Surgery

## 2015-05-29 DIAGNOSIS — S72401D Unspecified fracture of lower end of right femur, subsequent encounter for closed fracture with routine healing: Secondary | ICD-10-CM

## 2015-05-29 DIAGNOSIS — S06891D Other specified intracranial injury with loss of consciousness of 30 minutes or less, subsequent encounter: Secondary | ICD-10-CM

## 2015-05-29 DIAGNOSIS — S7290XA Unspecified fracture of unspecified femur, initial encounter for closed fracture: Secondary | ICD-10-CM | POA: Insufficient documentation

## 2015-05-29 DIAGNOSIS — S72401S Unspecified fracture of lower end of right femur, sequela: Secondary | ICD-10-CM

## 2015-05-29 MED ORDER — METHOCARBAMOL 500 MG PO TABS
1000.0000 mg | ORAL_TABLET | Freq: Four times a day (QID) | ORAL | Status: DC
  Administered 2015-05-29 – 2015-05-30 (×4): 1000 mg via ORAL
  Filled 2015-05-29 (×4): qty 2
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
## 2015-05-29 NOTE — Progress Notes (Signed)
Occupational Therapy Discharge Summary and OT Intervention  Patient Details  Name: Gary Hebert MRN: [id number] Date of Birth: Apr 02, 1985  Today's Date: 05/29/2015 OT Individual Time: 1101-1159 and 1500-1540 OT Individual Time Calculation (min): 58 min and 40 min    Patient has met 5 of 5 long term goals due to improved activity tolerance, improved balance, ability to compensate for deficits and decreased pain and anxiety.  Patient to discharge at overall set up for bathing and mod A overall level.  Patient's care partner is independent to provide the necessary physical assistance at discharge.    Reasons goals not met: all goals met  Recommendation:  Patient will benefit from ongoing skilled OT services in home health setting to continue to advance functional skills in the area of BADL and iADL.  Equipment: drop arm BSC  Reasons for discharge: treatment goals met  Patient/family agrees with progress made and goals achieved: Yes   OT Intervention: Session 1: Upon entering the room, pt seated in wheelchair and fully dressed. Pt reports that he performed self care tasks prior to OT entering the room. Pt propelled wheelchair down hallway and over graded incline with increased time but independently. Pt operated elevator for wheelchair level to go to ground floor. Pt then propelling wheelchair on variety of surfaces, maneuvered around obstacles, and performed tight turns with mod I. Pt returning to room in same manner as stated above. Pt set up wheelchair, managed leg rests, and transferred into bed via slide board with mod I. Call bell and all needed items within reach upon exiting the room.   Session 2: Upon entering the room, pt supine in bed and wife present in the room for continued family education. His wife has been present for several sessions and is familiar with pt's progress towards OT goals. OT educating and confirming with caregiver that pt will continue to wash from drop arm  commode at home with suggested use of basin. OT also educated caregiver and pt on safety, organizing, and planning ahead for doctor's appointments and any community mobility task. Caregiver had several questions regarding home health services . OT also requesting pt to be an advocate for himself during home health therapy evaluations so all needs are met. Pt and caregiver report no further concerns at this time.   OT Discharge Precautions/Restrictions  Precautions Precautions: Fall Required Braces or Orthoses: Knee Immobilizer - Right;Other Brace/Splint Knee Immobilizer - Right: On at all times Other Brace/Splint: CAM boot LLE Restrictions Weight Bearing Restrictions: Yes RLE Weight Bearing: Weight bearing as tolerated LLE Weight Bearing: Partial weight bearing Pain Pain Assessment Pain Assessment: 0-10 Pain Score: 4  Pain Type: Acute pain Pain Location: Foot Pain Orientation: Left Pain Descriptors / Indicators: Aching Pain Frequency: Intermittent Pain Onset: With Activity Patients Stated Pain Goal: 2 Pain Intervention(s): Repositioned;Distraction Multiple Pain Sites: No ADL ADL ADL Comments: Refer to functional navigator Vision/Perception  Vision- History Baseline Vision/History: Wears glasses Wears Glasses: At all times Patient Visual Report: No change from baseline Vision- Assessment Vision Assessment?: No apparent visual deficits  Cognition Overall Cognitive Status: Within Functional Limits for tasks assessed Arousal/Alertness: Awake/alert Orientation Level: Oriented X4 Sustained Attention: Appears intact Memory: Appears intact Awareness: Appears intact Problem Solving: Appears intact Safety/Judgment: Appears intact Sensation Sensation Light Touch: Appears Intact Stereognosis: Appears Intact Hot/Cold: Appears Intact Proprioception: Appears Intact Coordination Gross Motor Movements are Fluid and Coordinated: Yes Fine Motor Movements are Fluid and Coordinated:  Yes Finger Nose Finger Test: [facility] Motor  Motor Motor -  Discharge Observations: ongoing pain from sternal fx and R knee fx limiting upright mobility Mobility  Bed Mobility Bed Mobility: Supine to Sit;Sit to Supine Supine to Sit: 6: Modified independent (Device/Increase time) Sit to Supine: 6: Modified independent (Device/Increase time) Transfers Sit to Stand: Other/comment (deferred 2/2 sternal pain with UE weighbearing and KI on RLE, pt using slide board for all transfers)  Trunk/Postural Assessment  Cervical Assessment Cervical Assessment: Within Functional Limits Thoracic Assessment Thoracic Assessment: Within Functional Limits Lumbar Assessment Lumbar Assessment: Within Functional Limits Postural Control Postural Control: Within Functional Limits  Balance Balance Balance Assessed: Yes Dynamic Sitting Balance Dynamic Sitting - Balance Support: No upper extremity supported Dynamic Sitting - Level of Assistance: 6: Modified independent (Device/Increase time) Dynamic Sitting - Balance Activities: Lateral lean/weight shifting;Forward lean/weight shifting;Reaching across midline Extremity/Trunk Assessment RUE Assessment RUE Assessment: Within Functional Limits LUE Assessment LUE Assessment: Within Functional Limits   See Function Navigator for Current Functional Status.  Gary Hebert 05/29/2015, 12:56 PM
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
## 2015-05-30 MED ORDER — ENOXAPARIN SODIUM 40 MG/0.4ML ~~LOC~~ SOLN: 40.0000 mg | SUBCUTANEOUS | Status: DC

## 2015-05-30 MED ORDER — TRAMADOL HCL 50 MG PO TABS: 50.0000 mg | ORAL_TABLET | Freq: Three times a day (TID) | ORAL | Status: DC

## 2015-05-30 MED ORDER — METHOCARBAMOL 500 MG PO TABS: 500.0000 mg | ORAL_TABLET | Freq: Four times a day (QID) | ORAL | Status: DC

## 2015-05-30 MED ORDER — ESCITALOPRAM OXALATE 5 MG PO TABS: 5.0000 mg | ORAL_TABLET | Freq: Every day | ORAL | Status: DC

## 2015-05-30 MED ORDER — SENNOSIDES-DOCUSATE SODIUM 8.6-50 MG PO TABS: 2.0000 | ORAL_TABLET | Freq: Two times a day (BID) | ORAL | Status: DC

## 2015-05-30 MED ORDER — POLYETHYLENE GLYCOL 3350 17 G PO PACK: 17.0000 g | PACK | Freq: Two times a day (BID) | ORAL | Status: DC

## 2015-05-30 MED ORDER — OXYCODONE HCL 10 MG PO TABS: 10.0000 mg | ORAL_TABLET | Freq: Four times a day (QID) | ORAL | Status: DC | PRN

## 2015-05-30 MED ORDER — LORAZEPAM 0.5 MG PO TABS: 0.5000 mg | ORAL_TABLET | Freq: Every day | ORAL | Status: DC | PRN
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
## 2015-05-30 NOTE — Progress Notes (Signed)
Social Work  Discharge Note  The overall goal for the admission was met for:   Discharge location: Yes - home with wife and family to provide 24/7 assistance  Length of Stay: Yes - 9 days  Discharge activity level: Yes - modified independent w/c level  Home/community participation: Yes  Services provided included: MD, RD, PT, OT, RN, TR, Pharmacy, Neuropsych and SW  Financial Services: Worker's Comp  Follow-up services arranged: Home Health: RN, PT, OT via [facility], DME: [phone] lightweight w/c with ELRs, cushion, drop arm commode, 30" tf board and CPM via [place] and Patient/Family has no preference for HH/DME agencies  Comments (or additional information):  Patient/Family verbalized understanding of follow-up arrangements: Yes  Individual responsible for coordination of the follow-up plan: pt  Confirmed correct DME delivered: Lucien Budney 05/30/2015    Jnae Thomaston
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
## 2015-05-30 NOTE — Progress Notes (Signed)
05/30/15 1110 nursing Patient discharged to home per wheelchair accompanied by NT , wife and family, Pt [facility]. Discharged instructions given by Elita QuickPam PA no further questions noted. Per wife she will be the one to administer lovenox shots (wife is Charity fundraiserN). Per patient he got his flu shot October this year.

## 2015-05-30 NOTE — Discharge Instructions (Signed)
Inpatient Rehab Discharge Instructions  Gary Hebert Discharge date and time:  05/30/15  Activities/Precautions/ Functional Status: Activity: activity as tolerated at wheelchair level. No weight on left leg. Wear boot left leg at all times.  Diet: regular diet Wound Care:  Wash incisions with soap and water. Pat dry and apply dry dressing. No creams, ointments or oils. Contact MD if you notice any pus/bloody drainage, redness, increase in swelling or pain OR fever or chills.   Functional status:  ___ No restrictions     ___ Walk up steps independently _X__ 24/7 supervision/assistance   ___ Walk up steps with assistance ___ Intermittent supervision/assistance  ___ Bathe/dress independently ___ Walk with walker     _X__ Bathe/dress with assistance ___ Walk Independently    ___ Shower independently ___ Walk with assistance    ___ Shower with assistance _X__ No alcohol     ___ Return to work/school ________    COMMUNITY REFERRALS UPON DISCHARGE:    Home Health:   PT     OT    RN                     [facility]   Phone: [phone]   Medical Equipment/Items Ordered: wheelchair, transfer board, drop arm commode, CPM                                                     Agency/Supplier:  [facility] @ [phone]   Special Instructions: 1. Keep legs elevated to help keep swelling down.     My questions have been answered and I understand these instructions. I will adhere to these goals and the provided educational materials after my discharge from the hospital.  Patient/Caregiver Signature _______________________________ Date __________  Clinician Signature _______________________________________ Date __________  Please bring this form and your medication list with you to all your follow-up doctor's appointments.

## 2015-05-30 NOTE — Progress Notes (Signed)
Orthopedic Tech Progress Note Patient Details:  Gary Hebert Aug 30, 1985 [id number]  Patient ID: Gary Hebert, male   DOB: Aug 30, 1985, 30 y.o.   MRN: [id number] Called in advanced brace order; spoke with Loreli SlotAntoinette  Gary Hebert 05/30/2015, 9:46 AM

## 2015-05-31 ENCOUNTER — Encounter (HOSPITAL_COMMUNITY): Payer: Self-pay | Admitting: Orthopedic Surgery

## 2015-06-03 ENCOUNTER — Telehealth: Payer: Self-pay

## 2015-06-03 NOTE — Telephone Encounter (Signed)
Left message with Ardelle ParkJean Erway, who is listed as an emergency contact. Tried [employer], but it went straight to voicemail. Left message with him as well.

## 2015-06-04 NOTE — Telephone Encounter (Signed)
[facility] would like to see if Dr. Riley KillSwartz agrees that the patient would benefit from a kneeler vs wheelchair once he is WBAT on right leg.  If you agree with this you can fax an order for it to [phone].  Any questions please call Victorino DikeJennifer at [phone].

## 2015-06-04 NOTE — Telephone Encounter (Signed)
Please advise 

## 2015-06-12 ENCOUNTER — Encounter
Payer: Worker's Compensation | Attending: Physical Medicine & Rehabilitation | Admitting: Physical Medicine & Rehabilitation

## 2015-06-12 ENCOUNTER — Encounter: Payer: Self-pay | Admitting: Physical Medicine & Rehabilitation

## 2015-06-12 DIAGNOSIS — K5900 Constipation, unspecified: Secondary | ICD-10-CM | POA: Insufficient documentation

## 2015-06-12 DIAGNOSIS — S069X1D Unspecified intracranial injury with loss of consciousness of 30 minutes or less, subsequent encounter: Secondary | ICD-10-CM

## 2015-06-12 DIAGNOSIS — Z7409 Other reduced mobility: Secondary | ICD-10-CM

## 2015-06-12 DIAGNOSIS — S72434A Nondisplaced fracture of medial condyle of right femur, initial encounter for closed fracture: Secondary | ICD-10-CM | POA: Diagnosis not present

## 2015-06-12 DIAGNOSIS — S069X0A Unspecified intracranial injury without loss of consciousness, initial encounter: Secondary | ICD-10-CM | POA: Diagnosis not present

## 2015-06-12 DIAGNOSIS — S022XXS Fracture of nasal bones, sequela: Secondary | ICD-10-CM

## 2015-06-12 DIAGNOSIS — S06891D Other specified intracranial injury with loss of consciousness of 30 minutes or less, subsequent encounter: Secondary | ICD-10-CM

## 2015-06-12 DIAGNOSIS — F419 Anxiety disorder, unspecified: Secondary | ICD-10-CM | POA: Diagnosis not present

## 2015-06-12 DIAGNOSIS — G8918 Other acute postprocedural pain: Secondary | ICD-10-CM | POA: Insufficient documentation

## 2015-06-12 DIAGNOSIS — S92312A Displaced fracture of first metatarsal bone, left foot, initial encounter for closed fracture: Secondary | ICD-10-CM | POA: Insufficient documentation

## 2015-06-12 DIAGNOSIS — Z9889 Other specified postprocedural states: Secondary | ICD-10-CM | POA: Diagnosis present

## 2015-06-12 DIAGNOSIS — F411 Generalized anxiety disorder: Secondary | ICD-10-CM

## 2015-06-12 DIAGNOSIS — M2550 Pain in unspecified joint: Secondary | ICD-10-CM | POA: Diagnosis not present

## 2015-06-12 DIAGNOSIS — S82001S Unspecified fracture of right patella, sequela: Secondary | ICD-10-CM

## 2015-06-12 DIAGNOSIS — R269 Unspecified abnormalities of gait and mobility: Secondary | ICD-10-CM | POA: Insufficient documentation

## 2015-06-12 DIAGNOSIS — K5903 Drug induced constipation: Secondary | ICD-10-CM

## 2015-06-12 DIAGNOSIS — S060X0A Concussion without loss of consciousness, initial encounter: Secondary | ICD-10-CM | POA: Diagnosis not present

## 2015-06-12 DIAGNOSIS — F4323 Adjustment disorder with mixed anxiety and depressed mood: Secondary | ICD-10-CM

## 2015-06-12 DIAGNOSIS — S92302S Fracture of unspecified metatarsal bone(s), left foot, sequela: Secondary | ICD-10-CM

## 2015-06-12 MED ORDER — LORAZEPAM 0.5 MG PO TABS: 0.5000 mg | ORAL_TABLET | Freq: Every day | ORAL | Status: DC | PRN

## 2015-06-12 MED ORDER — SENNOSIDES-DOCUSATE SODIUM 8.6-50 MG PO TABS: 2.0000 | ORAL_TABLET | Freq: Two times a day (BID) | ORAL | Status: DC

## 2015-06-12 MED ORDER — METHOCARBAMOL 500 MG PO TABS: 500.0000 mg | ORAL_TABLET | Freq: Four times a day (QID) | ORAL | Status: DC

## 2015-06-12 MED ORDER — OXYCODONE HCL 10 MG PO TABS: 10.0000 mg | ORAL_TABLET | Freq: Four times a day (QID) | ORAL | Status: AC | PRN

## 2015-06-12 MED ORDER — ESCITALOPRAM OXALATE 5 MG PO TABS: 5.0000 mg | ORAL_TABLET | Freq: Every day | ORAL | Status: DC

## 2015-06-12 MED ORDER — TRAMADOL HCL 50 MG PO TABS
50.0000 mg | ORAL_TABLET | Freq: Four times a day (QID) | ORAL | Status: DC
Start: 2015-06-12 — End: 2015-07-17
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
## 2015-07-17 ENCOUNTER — Encounter: Payer: Self-pay | Admitting: Physical Medicine & Rehabilitation

## 2015-07-17 ENCOUNTER — Encounter
Payer: Worker's Compensation | Attending: Physical Medicine & Rehabilitation | Admitting: Physical Medicine & Rehabilitation

## 2015-07-17 VITALS — BP 119/67 | HR 102 | Resp 14

## 2015-07-17 DIAGNOSIS — S92302S Fracture of unspecified metatarsal bone(s), left foot, sequela: Secondary | ICD-10-CM | POA: Diagnosis not present

## 2015-07-17 DIAGNOSIS — Z299 Encounter for prophylactic measures, unspecified: Secondary | ICD-10-CM

## 2015-07-17 DIAGNOSIS — S82001S Unspecified fracture of right patella, sequela: Secondary | ICD-10-CM

## 2015-07-17 DIAGNOSIS — R61 Generalized hyperhidrosis: Secondary | ICD-10-CM

## 2015-07-17 DIAGNOSIS — M25562 Pain in left knee: Secondary | ICD-10-CM | POA: Insufficient documentation

## 2015-07-17 DIAGNOSIS — G8918 Other acute postprocedural pain: Secondary | ICD-10-CM

## 2015-07-17 DIAGNOSIS — Z9889 Other specified postprocedural states: Secondary | ICD-10-CM | POA: Diagnosis not present

## 2015-07-17 DIAGNOSIS — K5901 Slow transit constipation: Secondary | ICD-10-CM

## 2015-07-17 DIAGNOSIS — Z7901 Long term (current) use of anticoagulants: Secondary | ICD-10-CM

## 2015-07-17 DIAGNOSIS — R269 Unspecified abnormalities of gait and mobility: Secondary | ICD-10-CM

## 2015-07-17 DIAGNOSIS — F4323 Adjustment disorder with mixed anxiety and depressed mood: Secondary | ICD-10-CM | POA: Diagnosis not present

## 2015-07-17 DIAGNOSIS — F419 Anxiety disorder, unspecified: Secondary | ICD-10-CM | POA: Insufficient documentation

## 2015-07-17 DIAGNOSIS — K5900 Constipation, unspecified: Secondary | ICD-10-CM | POA: Diagnosis not present

## 2015-07-17 MED ORDER — LORAZEPAM 0.5 MG PO TABS: 0.5000 mg | ORAL_TABLET | Freq: Every day | ORAL | Status: AC | PRN

## 2015-07-17 MED ORDER — TRAMADOL HCL 50 MG PO TABS: 50.0000 mg | ORAL_TABLET | Freq: Four times a day (QID) | ORAL | Status: AC

## 2015-07-17 MED ORDER — METHOCARBAMOL 500 MG PO TABS: 500.0000 mg | ORAL_TABLET | Freq: Four times a day (QID) | ORAL | Status: AC

## 2015-07-17 MED ORDER — SENNOSIDES-DOCUSATE SODIUM 8.6-50 MG PO TABS: 2.0000 | ORAL_TABLET | Freq: Two times a day (BID) | ORAL | Status: AC
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
## 2017-09-17 IMAGING — RF DG KNEE COMPLETE 4+V*R*
1 series · 7 of 7 positions shown · non-contrast
Comparison: Knee MRI, 05/15/2015

CLINICAL DATA: ORIF of the distal right femur. 45 seconds of fluoro
time.

EXAM:
RIGHT KNEE - COMPLETE 4+ VIEW

[Series 1: run · 7 of 7 slices shown]
[im 1/7]
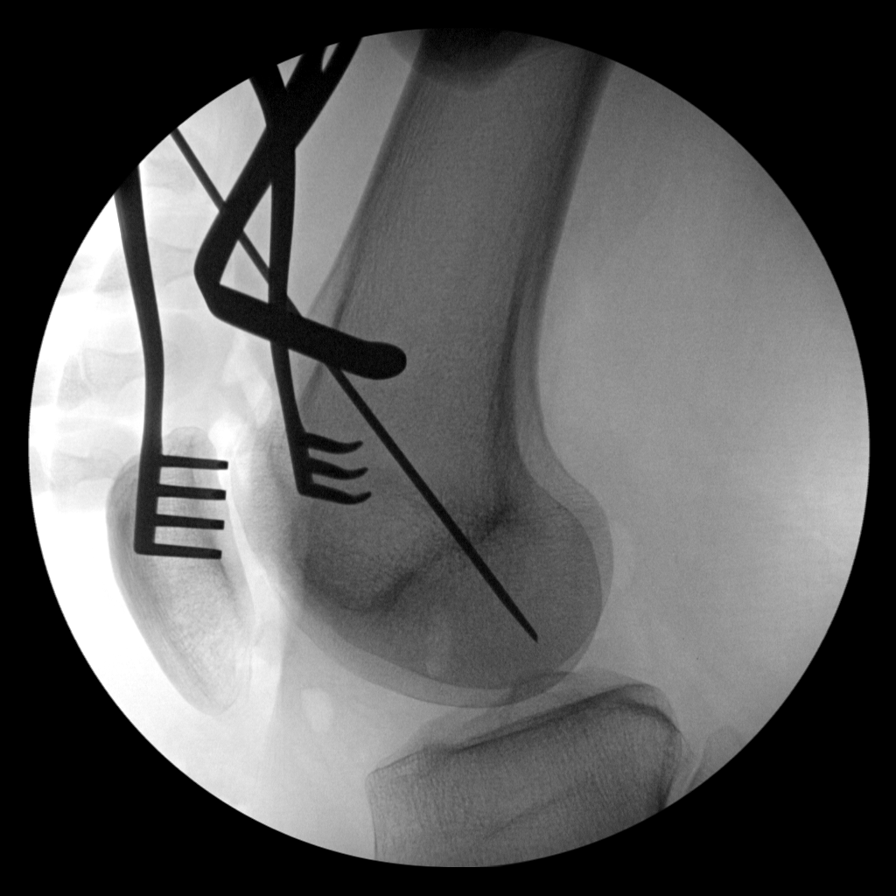
[im 2/7]
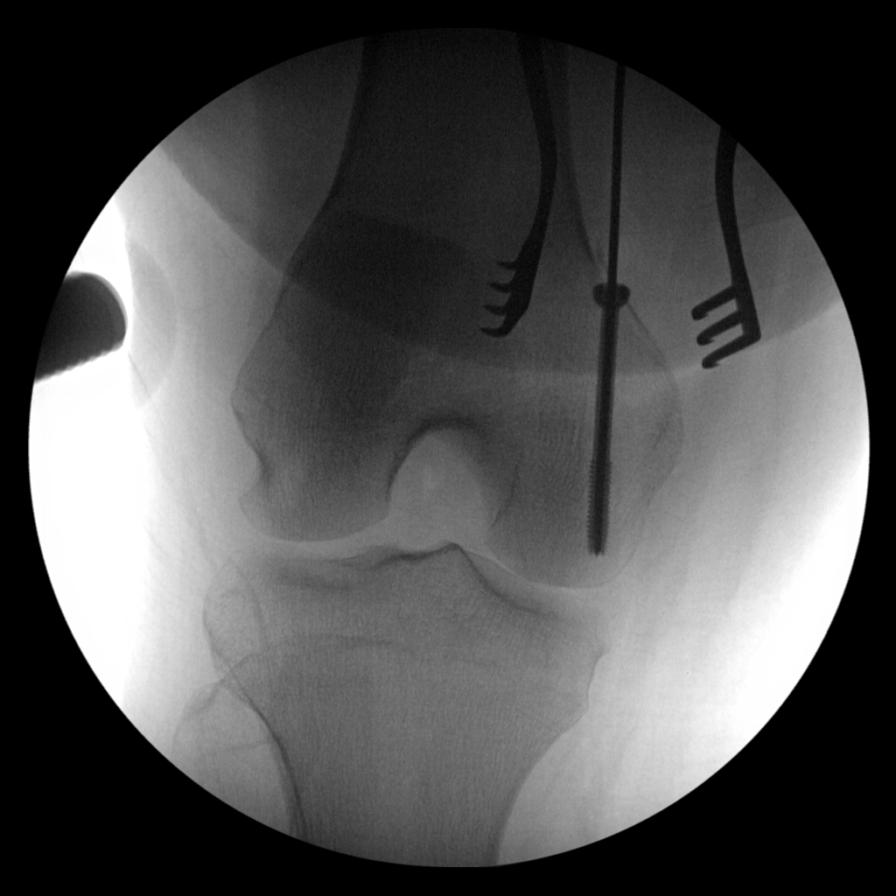
[im 3/7]
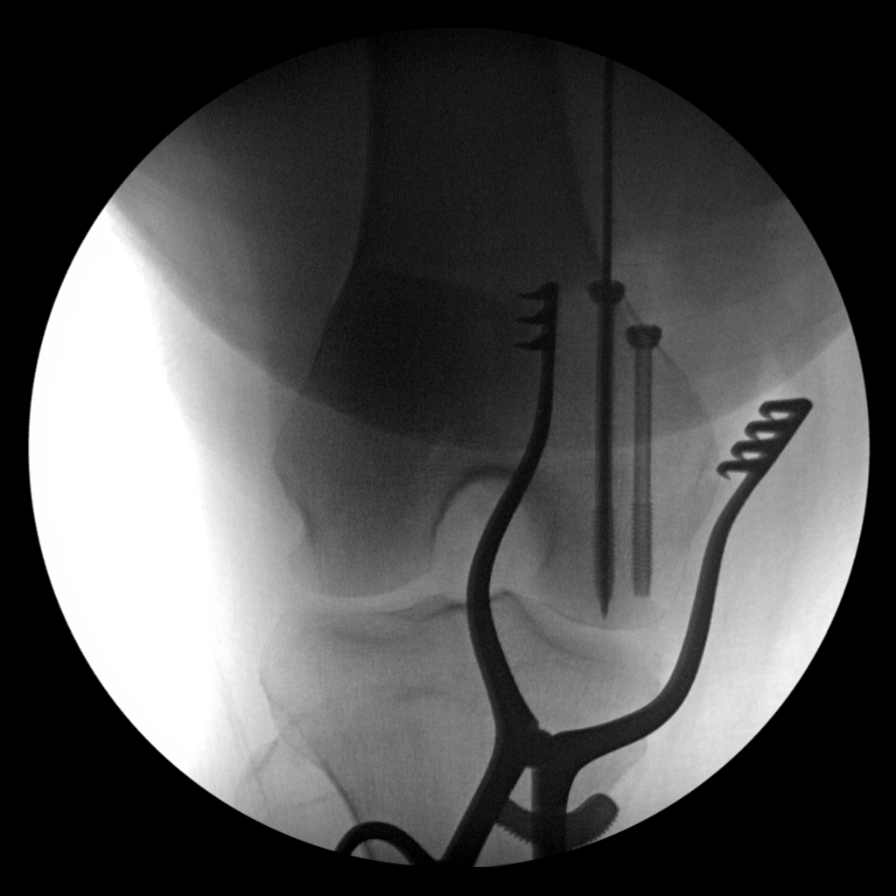
[im 4/7]
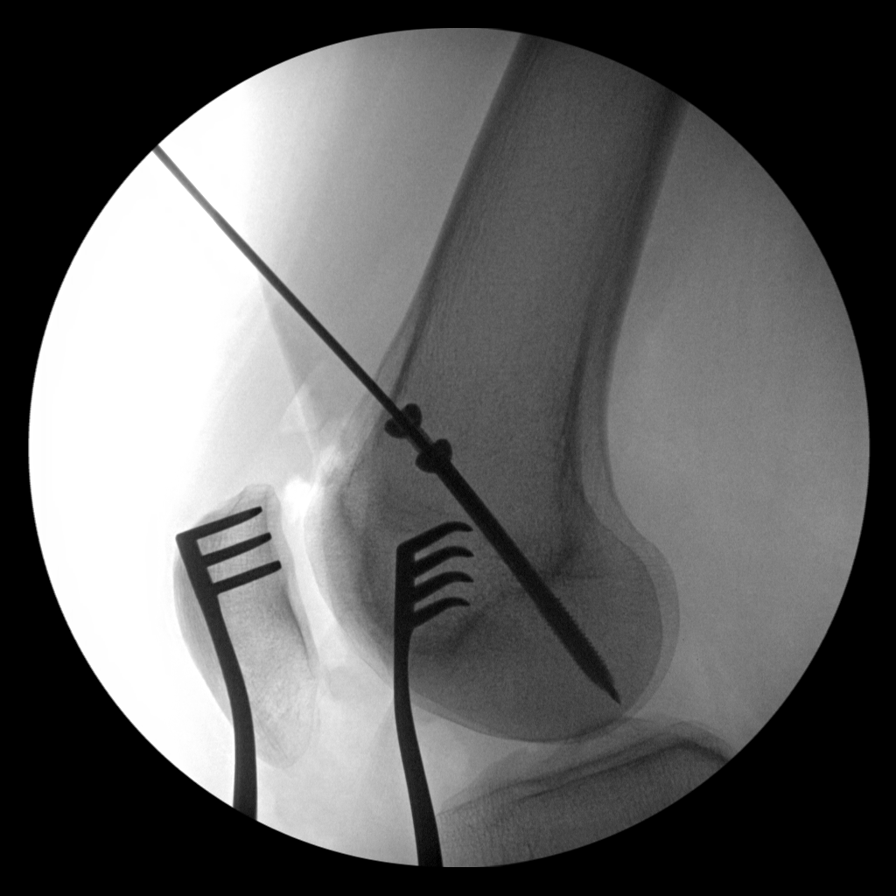
[im 5/7]
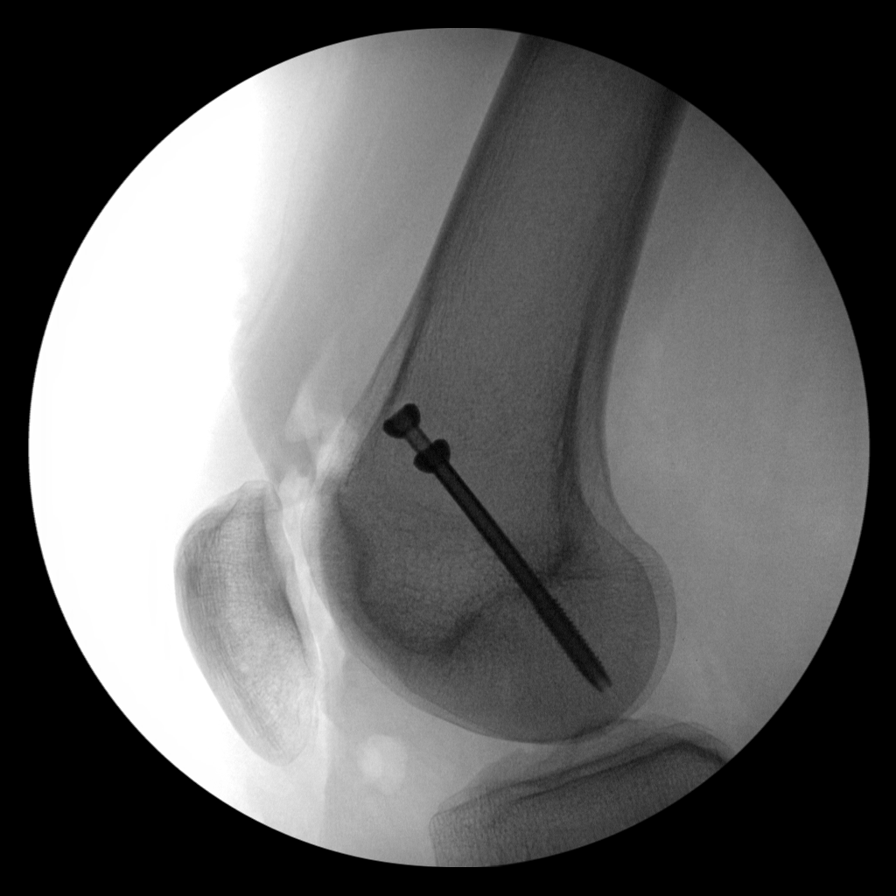
[im 6/7]
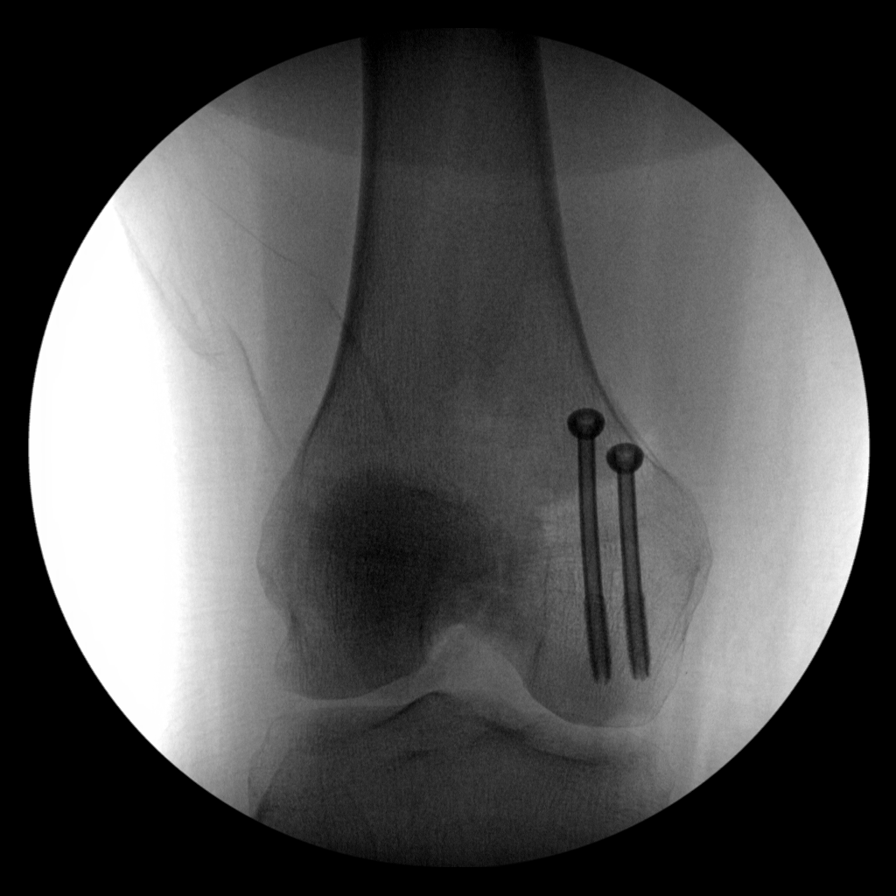
[im 7/7]
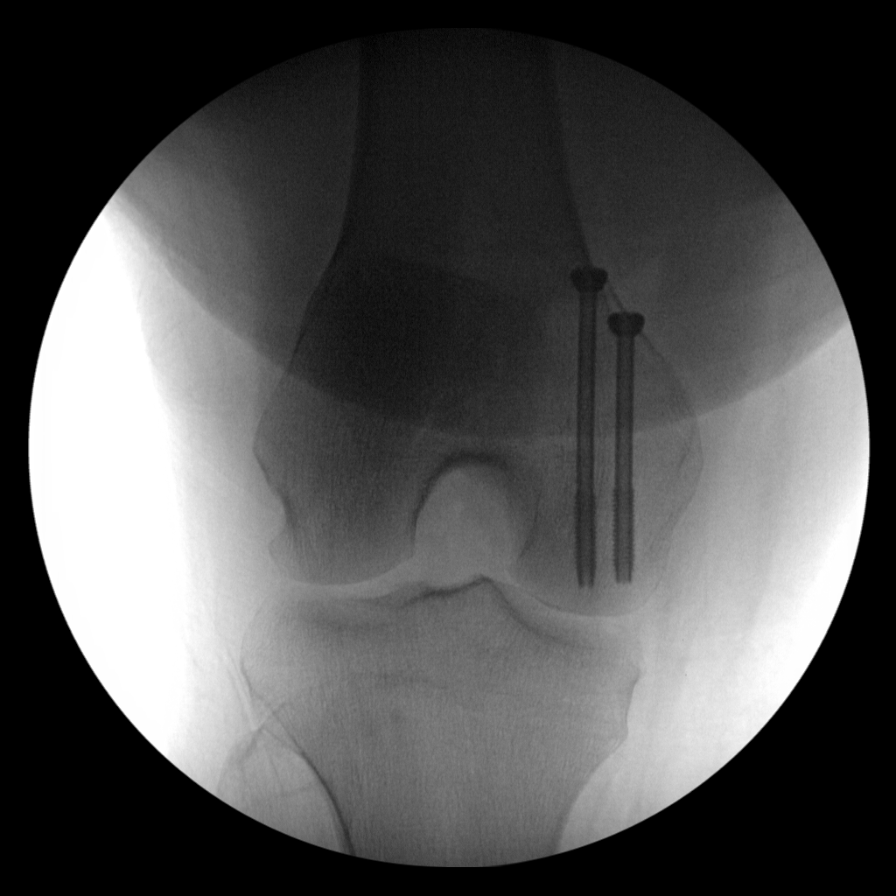

[7 of 7 positions shown; findings below may reference images not displayed]

FINDINGS: Seven submitted portable operative images show 2 screws inserted
across the medial aspect of the distal femoral metaphysis into the
medial femoral condyle. There is no acute fracture or evidence of an
operative complication.
IMPRESSION: Operative imaging from ORIF of distal right femur as described.
